# Patient Record
Sex: Female | Born: 1980 | Hispanic: Yes | Marital: Single | Smoking: Never smoker
Health system: Southern US, Community
[De-identification: ages and names within clinical notes are randomized; demographics above are authoritative.]

## PROBLEM LIST (undated history)

## (undated) DIAGNOSIS — Z789 Other specified health status: Secondary | ICD-10-CM

## (undated) HISTORY — PX: NO PAST SURGERIES: SHX2092

## (undated) HISTORY — DX: Other specified health status: Z78.9

---

## 2003-03-09 ENCOUNTER — Ambulatory Visit (HOSPITAL_COMMUNITY): Admission: RE | Admit: 2003-03-09 | Discharge: 2003-03-09 | Payer: Self-pay | Admitting: *Deleted

## 2003-08-07 ENCOUNTER — Inpatient Hospital Stay (HOSPITAL_COMMUNITY): Admission: AD | Admit: 2003-08-07 | Discharge: 2003-08-07 | Payer: Self-pay | Admitting: *Deleted

## 2003-08-07 ENCOUNTER — Inpatient Hospital Stay (HOSPITAL_COMMUNITY): Admission: AD | Admit: 2003-08-07 | Discharge: 2003-08-10 | Payer: Self-pay | Admitting: Obstetrics & Gynecology

## 2005-04-19 ENCOUNTER — Emergency Department (HOSPITAL_COMMUNITY): Admission: EM | Admit: 2005-04-19 | Discharge: 2005-04-20 | Payer: Self-pay | Admitting: *Deleted

## 2011-09-23 NOTE — L&D Delivery Note (Signed)
Delivery Note At 11:03 PM a viable female was delivered via Vaginal, Spontaneous Delivery (Presentation: Middle Occiput Posterior).  APGAR: 9, 9; weight .   Placenta status: Intact, Spontaneous.  Cord: 3 vessels with the following complications: None.  Cord pH: none  Anesthesia: Local  Episiotomy: None Lacerations: 2nd degree Suture Repair: 2.0 chromic Est. Blood Loss (mL): 350  Mom to postpartum.  Baby to nursery-stable.  HARPER,CHARLES A 08/22/2012, 11:28 PM

## 2012-02-27 LAB — OB RESULTS CONSOLE RUBELLA ANTIBODY, IGM: Rubella: IMMUNE

## 2012-02-27 LAB — OB RESULTS CONSOLE ABO/RH: RH Type: POSITIVE

## 2012-02-27 LAB — OB RESULTS CONSOLE ANTIBODY SCREEN: Antibody Screen: NEGATIVE

## 2012-02-27 LAB — OB RESULTS CONSOLE GC/CHLAMYDIA: Chlamydia: NEGATIVE

## 2012-02-27 LAB — OB RESULTS CONSOLE HEPATITIS B SURFACE ANTIGEN: Hepatitis B Surface Ag: NEGATIVE

## 2012-08-18 ENCOUNTER — Encounter (HOSPITAL_COMMUNITY): Payer: Self-pay | Admitting: *Deleted

## 2012-08-18 ENCOUNTER — Telehealth (HOSPITAL_COMMUNITY): Payer: Self-pay | Admitting: *Deleted

## 2012-08-18 NOTE — Telephone Encounter (Signed)
Preadmission screen  

## 2012-08-18 NOTE — Telephone Encounter (Signed)
Preadmission screen Interpreter number [id number] 

## 2012-08-21 ENCOUNTER — Encounter (HOSPITAL_COMMUNITY): Payer: Self-pay

## 2012-08-21 ENCOUNTER — Inpatient Hospital Stay (HOSPITAL_COMMUNITY)
Admission: AD | Admit: 2012-08-21 | Discharge: 2012-08-21 | Disposition: A | Discharge status: Home / Self Care | Payer: Self-pay | Source: Ambulatory Visit | Attending: Obstetrics | Admitting: Obstetrics

## 2012-08-21 DIAGNOSIS — O480 Post-term pregnancy: Secondary | ICD-10-CM | POA: Insufficient documentation

## 2012-08-21 NOTE — MAU Note (Signed)
Dr. Clearance Coots notified pt is outpt NST only, reactive NST, orders to d/c home, f/u as directed.

## 2012-08-21 NOTE — MAU Note (Signed)
Pt is 41 weeks, denies problems with pregnancy, for Outpt NST only.

## 2012-08-22 ENCOUNTER — Encounter (HOSPITAL_COMMUNITY): Payer: Self-pay | Admitting: Obstetrics and Gynecology

## 2012-08-22 ENCOUNTER — Inpatient Hospital Stay (HOSPITAL_COMMUNITY): Payer: Medicaid Other | Admitting: Anesthesiology

## 2012-08-22 ENCOUNTER — Encounter (HOSPITAL_COMMUNITY): Payer: Self-pay | Admitting: Anesthesiology

## 2012-08-22 ENCOUNTER — Inpatient Hospital Stay (HOSPITAL_COMMUNITY)
Admission: AD | Admit: 2012-08-22 | Discharge: 2012-08-24 | DRG: 775 | Disposition: A | Discharge status: Home / Self Care | Payer: Medicaid Other | Source: Ambulatory Visit | Attending: Obstetrics | Admitting: Obstetrics

## 2012-08-22 LAB — CBC
Hemoglobin: 10.8 g/dL — ABNORMAL LOW (ref 12.0–15.0)
MCH: 26.2 pg (ref 26.0–34.0)
RBC: 4.12 MIL/uL (ref 3.87–5.11)
WBC: 10.2 10*3/uL (ref 4.0–10.5)

## 2012-08-22 LAB — ABO/RH: ABO/RH(D): O POS

## 2012-08-22 MED ORDER — EPHEDRINE 5 MG/ML INJ
10.0000 mg | INTRAVENOUS | Status: DC | PRN
  Filled 2012-08-22: qty 4

## 2012-08-22 MED ORDER — LACTATED RINGERS IV SOLN: 500.0000 mL | Freq: Once | INTRAVENOUS | Status: DC

## 2012-08-22 MED ORDER — ACETAMINOPHEN 325 MG PO TABS: 650.0000 mg | ORAL_TABLET | ORAL | Status: DC | PRN

## 2012-08-22 MED ORDER — FENTANYL 2.5 MCG/ML BUPIVACAINE 1/10 % EPIDURAL INFUSION (WH - ANES)
14.0000 mL/h | INTRAMUSCULAR | Status: DC
  Filled 2012-08-22: qty 125

## 2012-08-22 MED ORDER — LACTATED RINGERS IV SOLN
INTRAVENOUS | Status: DC
  Administered 2012-08-22: 999 mL/h via INTRAVENOUS

## 2012-08-22 MED ORDER — PHENYLEPHRINE 40 MCG/ML (10ML) SYRINGE FOR IV PUSH (FOR BLOOD PRESSURE SUPPORT): 80.0000 ug | PREFILLED_SYRINGE | INTRAVENOUS | Status: DC | PRN

## 2012-08-22 MED ORDER — CITRIC ACID-SODIUM CITRATE 334-500 MG/5ML PO SOLN: 30.0000 mL | ORAL | Status: DC | PRN

## 2012-08-22 MED ORDER — LIDOCAINE HCL (PF) 1 % IJ SOLN
INTRAMUSCULAR | Status: DC | PRN
  Administered 2012-08-22: 7 mL
  Administered 2012-08-22: 9 mL

## 2012-08-22 MED ORDER — OXYCODONE-ACETAMINOPHEN 5-325 MG PO TABS: 1.0000 | ORAL_TABLET | ORAL | Status: DC | PRN

## 2012-08-22 MED ORDER — PHENYLEPHRINE 40 MCG/ML (10ML) SYRINGE FOR IV PUSH (FOR BLOOD PRESSURE SUPPORT)
80.0000 ug | PREFILLED_SYRINGE | INTRAVENOUS | Status: DC | PRN
  Filled 2012-08-22: qty 5

## 2012-08-22 MED ORDER — LIDOCAINE HCL (PF) 1 % IJ SOLN
30.0000 mL | INTRAMUSCULAR | Status: DC | PRN
  Administered 2012-08-22: 30 mL via SUBCUTANEOUS
  Filled 2012-08-22: qty 30

## 2012-08-22 MED ORDER — LACTATED RINGERS IV SOLN: 500.0000 mL | INTRAVENOUS | Status: DC | PRN

## 2012-08-22 MED ORDER — EPHEDRINE 5 MG/ML INJ: 10.0000 mg | INTRAVENOUS | Status: DC | PRN

## 2012-08-22 MED ORDER — FENTANYL 2.5 MCG/ML BUPIVACAINE 1/10 % EPIDURAL INFUSION (WH - ANES)
INTRAMUSCULAR | Status: DC | PRN
  Administered 2012-08-22: 14 mL/h via EPIDURAL

## 2012-08-22 MED ORDER — LACTATED RINGERS IV SOLN: INTRAVENOUS | Status: DC

## 2012-08-22 MED ORDER — OXYTOCIN 40 UNITS IN LACTATED RINGERS INFUSION - SIMPLE MED
62.5000 mL/h | INTRAVENOUS | Status: DC
  Filled 2012-08-22: qty 1000

## 2012-08-22 MED ORDER — ONDANSETRON HCL 4 MG/2ML IJ SOLN: 4.0000 mg | Freq: Four times a day (QID) | INTRAMUSCULAR | Status: DC | PRN

## 2012-08-22 MED ORDER — DIPHENHYDRAMINE HCL 50 MG/ML IJ SOLN: 12.5000 mg | INTRAMUSCULAR | Status: DC | PRN

## 2012-08-22 MED ORDER — FLEET ENEMA 7-19 GM/118ML RE ENEM: 1.0000 | ENEMA | RECTAL | Status: DC | PRN

## 2012-08-22 MED ORDER — OXYTOCIN BOLUS FROM INFUSION
500.0000 mL | INTRAVENOUS | Status: DC
  Administered 2012-08-22: 500 mL via INTRAVENOUS

## 2012-08-22 MED ORDER — IBUPROFEN 600 MG PO TABS: 600.0000 mg | ORAL_TABLET | Freq: Four times a day (QID) | ORAL | Status: DC | PRN

## 2012-08-22 NOTE — Anesthesia Procedure Notes (Signed)
Epidural Patient location during procedure: OB Start time: 08/22/2012 9:34 PM End time: 08/22/2012 9:38 PM  Staffing Anesthesiologist: Sandrea Hughs Performed by: anesthesiologist   Preanesthetic Checklist Completed: patient identified, site marked, surgical consent, pre-op evaluation, timeout performed, IV checked, risks and benefits discussed and monitors and equipment checked  Epidural Patient position: sitting Prep: site prepped and draped and DuraPrep Patient monitoring: continuous pulse ox and blood pressure Approach: midline Injection technique: LOR air  Needle:  Needle type: Tuohy  Needle gauge: 17 G Needle length: 9 cm and 9 Needle insertion depth: 5 cm cm Catheter type: closed end flexible Catheter size: 19 Gauge Catheter at skin depth: 10 cm Test dose: negative and Other  Assessment Sensory level: T8 Events: blood not aspirated, injection not painful, no injection resistance, negative IV test and no paresthesia  Additional Notes Reason for block:procedure for pain

## 2012-08-22 NOTE — Progress Notes (Signed)
Jean Dixon is a 31 y.o. G2P1001 at [redacted]w[redacted]d by LMP admitted for active labor  Subjective:   Objective: BP 113/70  Pulse 81  Temp 97.9 F (36.6 C) (Oral)  Resp 18  Ht 5\' 1"  (1.549 m)  Wt 212 lb (96.163 kg)  BMI 40.06 kg/m2  SpO2 100%      FHT:  FHR: 150 bpm, variability: moderate,  accelerations:  Present,  decelerations:  Absent UC:   regular, every 3 minutes SVE:   Dilation: 10 Effacement (%): 100 Station: +1;+2 Exam by:: T.Sprague Rn  Labs: Lab Results  Component Value Date   WBC 10.2 08/22/2012   HGB 10.8* 08/22/2012   HCT 33.6* 08/22/2012   MCV 81.6 08/22/2012   PLT 253 08/22/2012    Assessment / Plan: Spontaneous labor, progressing normally  Labor: Progressing normally Preeclampsia:  n/a Fetal Wellbeing:  Category I Pain Control:  Epidural I/D:  n/a Anticipated MOD:  NSVD  Jean Dixon A 08/22/2012, 10:43 PM

## 2012-08-22 NOTE — Anesthesia Preprocedure Evaluation (Signed)
Anesthesia Evaluation  Patient identified by MRN, date of birth, ID band Patient awake    Reviewed: Allergy & Precautions, H&P , NPO status , Patient's Chart, lab work & pertinent test results  Airway Mallampati: II TM Distance: >3 FB Neck ROM: full    Dental No notable dental hx.    Pulmonary neg pulmonary ROS,  breath sounds clear to auscultation  Pulmonary exam normal       Cardiovascular negative cardio ROS      Neuro/Psych negative neurological ROS  negative psych ROS   GI/Hepatic negative GI ROS, Neg liver ROS,   Endo/Other  Morbid obesity  Renal/GU negative Renal ROS  negative genitourinary   Musculoskeletal   Abdominal (+) + obese,   Peds negative pediatric ROS (+)  Hematology negative hematology ROS (+)   Anesthesia Other Findings   Reproductive/Obstetrics (+) Pregnancy                           Anesthesia Physical Anesthesia Plan  ASA: III  Anesthesia Plan: Epidural   Post-op Pain Management:    Induction:   Airway Management Planned:   Additional Equipment:   Intra-op Plan:   Post-operative Plan:   Informed Consent: I have reviewed the patients History and Physical, chart, labs and discussed the procedure including the risks, benefits and alternatives for the proposed anesthesia with the patient or authorized representative who has indicated his/her understanding and acceptance.     Plan Discussed with:   Anesthesia Plan Comments:         Anesthesia Quick Evaluation

## 2012-08-22 NOTE — MAU Note (Signed)
Contractions, ? Leaking fluid since 1200 today.

## 2012-08-22 NOTE — H&P (Signed)
Jean Dixon is a 31 y.o. female presenting for UC's. Maternal Medical History:  Reason for admission: Reason for admission: contractions.  31 yo G2 P1.   EDC 08-14-12.  Presents with UC's  Contractions: Onset was 3-5 hours ago.   Frequency: regular.   Perceived severity is moderate.    Fetal activity: Perceived fetal activity is normal.   Last perceived fetal movement was within the past hour.    Prenatal complications: no prenatal complications Prenatal Complications - Diabetes: none.    OB History    Grav Para Term Preterm Abortions TAB SAB Ect Mult Living   2 1 1       1      Past Medical History  Diagnosis Date  . No pertinent past medical history    Past Surgical History  Procedure Date  . No past surgeries    Family History: family history includes Asthma in her son. Social History:  reports that she has never smoked. She has never used smokeless tobacco. She reports that she does not drink alcohol or use illicit drugs.   Prenatal Transfer Tool  Maternal Diabetes: No Genetic Screening: Normal Maternal Ultrasounds/Referrals: Normal Fetal Ultrasounds or other Referrals:  None Maternal Substance Abuse:  No Significant Maternal Medications:  Meds include: Other:  Significant Maternal Lab Results:  Lab values include: Group B Strep negative Other Comments:  None  Review of Systems  All other systems reviewed and are negative.    Dilation: 5.5 Effacement (%): 100 Station: -2 Exam by:: Raelyn Mora, RN Blood pressure 125/85, pulse 77, temperature 97.7 F (36.5 C), temperature source Oral, resp. rate 18, height 5\' 1"  (1.549 m), weight 212 lb (96.163 kg), SpO2 100.00%. Maternal Exam:  Uterine Assessment: Contraction strength is firm.  Contraction frequency is regular.   Abdomen: Patient reports no abdominal tenderness. Fetal presentation: vertex  Introitus: Normal vulva. Normal vagina.  Pelvis: adequate for delivery.   Cervix: Cervix evaluated by  digital exam.     Physical Exam  Nursing note and vitals reviewed. Constitutional: She is oriented to person, place, and time. She appears well-developed and well-nourished.  HENT:  Head: Normocephalic and atraumatic.  Eyes: Conjunctivae normal are normal. Pupils are equal, round, and reactive to light.  Neck: Normal range of motion. Neck supple.  Cardiovascular: Normal rate and regular rhythm.   Respiratory: Effort normal.  GI: Soft.  Genitourinary: Vagina normal and uterus normal.  Musculoskeletal: Normal range of motion.  Neurological: She is alert and oriented to person, place, and time.  Skin: Skin is warm and dry.  Psychiatric: She has a normal mood and affect. Her behavior is normal. Judgment and thought content normal.    Prenatal labs: ABO, Rh: O/Positive/-- (06/07 0000) Antibody: Negative (06/07 0000) Rubella: Immune (06/07 0000) RPR: Nonreactive (06/07 0000)  HBsAg: Negative (06/07 0000)  HIV: Non-reactive (06/07 0000)  GBS: Negative (10/22 0000)   Assessment/Plan: 41.1 weeks.  Early labor.  Admit.   HARPER,CHARLES A 08/22/2012, 8:51 PM

## 2012-08-22 NOTE — MAU Note (Signed)
"  Hurting since 1800 this evening.  The UC's are about every 3-5 mins  Apart.  I was 3cm on Tuesday.  (+) FM.  I have been having brown d/c with blood since 1200 this afternoon."

## 2012-08-23 ENCOUNTER — Encounter (HOSPITAL_COMMUNITY): Payer: Self-pay | Admitting: *Deleted

## 2012-08-23 ENCOUNTER — Inpatient Hospital Stay (HOSPITAL_COMMUNITY): Admission: RE | Admit: 2012-08-23 | Payer: Self-pay | Source: Ambulatory Visit

## 2012-08-23 LAB — CBC
HCT: 28.7 % — ABNORMAL LOW (ref 36.0–46.0)
MCH: 25.9 pg — ABNORMAL LOW (ref 26.0–34.0)
MCV: 81.8 fL (ref 78.0–100.0)
Platelets: 243 10*3/uL (ref 150–400)
RDW: 15.2 % (ref 11.5–15.5)

## 2012-08-23 LAB — RPR: RPR Ser Ql: NONREACTIVE

## 2012-08-23 MED ORDER — ONDANSETRON HCL 4 MG/2ML IJ SOLN: 4.0000 mg | INTRAMUSCULAR | Status: DC | PRN

## 2012-08-23 MED ORDER — TETANUS-DIPHTH-ACELL PERTUSSIS 5-2.5-18.5 LF-MCG/0.5 IM SUSP
0.5000 mL | Freq: Once | INTRAMUSCULAR | Status: AC
  Administered 2012-08-23: 0.5 mL via INTRAMUSCULAR
  Filled 2012-08-23: qty 0.5

## 2012-08-23 MED ORDER — CITRIC ACID-SODIUM CITRATE 334-500 MG/5ML PO SOLN: 30.0000 mL | ORAL | Status: DC | PRN

## 2012-08-23 MED ORDER — DIBUCAINE 1 % RE OINT: 1.0000 "application " | TOPICAL_OINTMENT | RECTAL | Status: DC | PRN

## 2012-08-23 MED ORDER — WITCH HAZEL-GLYCERIN EX PADS: 1.0000 "application " | MEDICATED_PAD | CUTANEOUS | Status: DC | PRN

## 2012-08-23 MED ORDER — ONDANSETRON HCL 4 MG/2ML IJ SOLN: 4.0000 mg | Freq: Four times a day (QID) | INTRAMUSCULAR | Status: DC | PRN

## 2012-08-23 MED ORDER — MEDROXYPROGESTERONE ACETATE 150 MG/ML IM SUSP: 150.0000 mg | INTRAMUSCULAR | Status: DC | PRN

## 2012-08-23 MED ORDER — DIPHENHYDRAMINE HCL 25 MG PO CAPS: 25.0000 mg | ORAL_CAPSULE | Freq: Four times a day (QID) | ORAL | Status: DC | PRN

## 2012-08-23 MED ORDER — IBUPROFEN 600 MG PO TABS
600.0000 mg | ORAL_TABLET | Freq: Four times a day (QID) | ORAL | Status: DC | PRN
  Filled 2012-08-23 (×4): qty 1

## 2012-08-23 MED ORDER — IBUPROFEN 600 MG PO TABS
600.0000 mg | ORAL_TABLET | Freq: Four times a day (QID) | ORAL | Status: DC
  Administered 2012-08-23 – 2012-08-24 (×6): 600 mg via ORAL
  Filled 2012-08-23 (×2): qty 1

## 2012-08-23 MED ORDER — SIMETHICONE 80 MG PO CHEW: 80.0000 mg | CHEWABLE_TABLET | ORAL | Status: DC | PRN

## 2012-08-23 MED ORDER — LANOLIN HYDROUS EX OINT: TOPICAL_OINTMENT | CUTANEOUS | Status: DC | PRN

## 2012-08-23 MED ORDER — OXYTOCIN 40 UNITS IN LACTATED RINGERS INFUSION - SIMPLE MED: 62.5000 mL/h | INTRAVENOUS | Status: DC

## 2012-08-23 MED ORDER — SENNOSIDES-DOCUSATE SODIUM 8.6-50 MG PO TABS
2.0000 | ORAL_TABLET | Freq: Every day | ORAL | Status: DC
  Administered 2012-08-23: 2 via ORAL

## 2012-08-23 MED ORDER — LIDOCAINE HCL (PF) 1 % IJ SOLN
30.0000 mL | INTRAMUSCULAR | Status: DC | PRN
  Filled 2012-08-23: qty 30

## 2012-08-23 MED ORDER — ZOLPIDEM TARTRATE 5 MG PO TABS: 5.0000 mg | ORAL_TABLET | Freq: Every evening | ORAL | Status: DC | PRN

## 2012-08-23 MED ORDER — OXYCODONE-ACETAMINOPHEN 5-325 MG PO TABS: 1.0000 | ORAL_TABLET | ORAL | Status: DC | PRN

## 2012-08-23 MED ORDER — BENZOCAINE-MENTHOL 20-0.5 % EX AERO
1.0000 "application " | INHALATION_SPRAY | CUTANEOUS | Status: DC | PRN
  Administered 2012-08-23: 1 via TOPICAL
  Filled 2012-08-23: qty 56

## 2012-08-23 MED ORDER — LACTATED RINGERS IV SOLN: 500.0000 mL | INTRAVENOUS | Status: DC | PRN

## 2012-08-23 MED ORDER — ONDANSETRON HCL 4 MG PO TABS: 4.0000 mg | ORAL_TABLET | ORAL | Status: DC | PRN

## 2012-08-23 MED ORDER — OXYTOCIN BOLUS FROM INFUSION: 500.0000 mL | INTRAVENOUS | Status: DC

## 2012-08-23 MED ORDER — OXYTOCIN 40 UNITS IN LACTATED RINGERS INFUSION - SIMPLE MED: 62.5000 mL/h | INTRAVENOUS | Status: DC | PRN

## 2012-08-23 MED ORDER — ACETAMINOPHEN 325 MG PO TABS: 650.0000 mg | ORAL_TABLET | ORAL | Status: DC | PRN

## 2012-08-23 MED ORDER — FLEET ENEMA 7-19 GM/118ML RE ENEM: 1.0000 | ENEMA | RECTAL | Status: DC | PRN

## 2012-08-23 MED ORDER — PRENATAL MULTIVITAMIN CH
1.0000 | ORAL_TABLET | Freq: Every day | ORAL | Status: DC
  Administered 2012-08-24: 1 via ORAL
  Filled 2012-08-23 (×3): qty 1

## 2012-08-23 NOTE — Progress Notes (Signed)
UR chart review completed.  

## 2012-08-23 NOTE — Anesthesia Postprocedure Evaluation (Signed)
Anesthesia Post Note  Patient: Jean Dixon  Procedure(s) Performed: * No procedures listed *  Anesthesia type: Epidural  Patient location: Mother/Baby  Post pain: Pain level controlled  Post assessment: Post-op Vital signs reviewed  Last Vitals:  Filed Vitals:   08/23/12 0649  BP: 113/58  Pulse: 65  Temp: 36.6 C  Resp: 18    Post vital signs: Reviewed  Level of consciousness:alert  Complications: No apparent anesthesia complications

## 2012-08-24 LAB — TYPE AND SCREEN: Unit division: 0

## 2012-08-24 NOTE — Discharge Summary (Signed)
Obstetric Discharge Summary Reason for Admission: onset of labor Prenatal Procedures: none Intrapartum Procedures: spontaneous vaginal delivery Postpartum Procedures: none Complications-Operative and Postpartum: none Hemoglobin  Date Value Range Status  08/23/2012 9.1* 12.0 - 15.0 g/dL Final     HCT  Date Value Range Status  08/23/2012 28.7* 36.0 - 46.0 % Final    Physical Exam:  General: alert Lochia: appropriate Uterine Fundus: firm Incision: healing well DVT Evaluation: No evidence of DVT seen on physical exam.  Discharge Diagnoses: Term Pregnancy-delivered  Discharge Information: Date: 08/24/2012 Activity: pelvic rest Diet: routine Medications: Percocet Condition: stable Instructions: refer to practice specific booklet Discharge to: home Follow-up Information    Call in 6 weeks to follow up.   Contact information:   b [address]all         Newborn Data: Live born female  Birth Weight: 8 lb 5.5 oz (3785 g) APGAR: 9, 9  Home with mother.  MARSHALL,BERNARD A 08/24/2012, 7:35 AM

## 2012-08-24 NOTE — Discharge Summary (Signed)
Obstetric Discharge Summary Reason for Admission: onset of labor Prenatal Procedures: none Intrapartum Procedures: spontaneous vaginal delivery Postpartum Procedures: none Complications-Operative and Postpartum: none Hemoglobin  Date Value Range Status  08/23/2012 9.1* 12.0 - 15.0 g/dL Final     HCT  Date Value Range Status  08/23/2012 28.7* 36.0 - 46.0 % Final    Physical Exam:  General: alert Lochia: appropriate Uterine Fundus: firm Incision: healing well DVT Evaluation: No evidence of DVT seen on physical exam.  Discharge Diagnoses: Term Pregnancy-delivered  Discharge Information: Date: 08/24/2012 Activity: pelvic rest Diet: routine Medications: Percocet Condition: improved Instructions: refer to practice specific booklet Discharge to: home Follow-up Information    Call in 6 weeks to follow up.   Contact information:   b [address]         Newborn Data: Live born female  Birth Weight: 8 lb 5.5 oz (3785 g) APGAR: 9, 9  Home with mother.  Saxon Barich A 08/24/2012, 7:40 AM

## 2012-08-30 ENCOUNTER — Ambulatory Visit (HOSPITAL_COMMUNITY): Payer: MEDICAID

## 2013-08-16 LAB — OB RESULTS CONSOLE RPR: RPR: NONREACTIVE

## 2013-08-16 LAB — OB RESULTS CONSOLE HIV ANTIBODY (ROUTINE TESTING): HIV: NONREACTIVE

## 2013-09-22 NOTE — L&D Delivery Note (Signed)
Delivery Note At 3:09 PM a viable female was delivered via  (Presentation: ;  ).  APGAR: , ; weight .   Placenta status: , .  Cord:  with the following complications: .  Cord pH: not done  Anesthesia:   Episiotomy:  Lacerations:  Suture Repair: 2.0 vicryl Est. Blood Loss (mL):   Mom to postpartum.  Baby to Couplet care / Skin to Skin.  Shaunda Tipping A 03/01/2014, 3:18 PM

## 2013-10-13 ENCOUNTER — Other Ambulatory Visit (HOSPITAL_COMMUNITY): Payer: Self-pay | Admitting: Obstetrics

## 2013-10-13 ENCOUNTER — Ambulatory Visit (HOSPITAL_COMMUNITY)
Admission: RE | Admit: 2013-10-13 | Discharge: 2013-10-13 | Disposition: A | Discharge status: Home / Self Care | Payer: Medicaid Other | Source: Ambulatory Visit | Attending: Obstetrics | Admitting: Obstetrics

## 2013-10-13 ENCOUNTER — Encounter (HOSPITAL_COMMUNITY): Payer: Self-pay

## 2013-10-13 DIAGNOSIS — R7611 Nonspecific reaction to tuberculin skin test without active tuberculosis: Secondary | ICD-10-CM | POA: Insufficient documentation

## 2013-10-13 DIAGNOSIS — I517 Cardiomegaly: Secondary | ICD-10-CM | POA: Insufficient documentation

## 2014-01-24 LAB — OB RESULTS CONSOLE GC/CHLAMYDIA
Chlamydia: NEGATIVE
GC PROBE AMP, GENITAL: NEGATIVE

## 2014-01-24 LAB — OB RESULTS CONSOLE GBS
GBS: NEGATIVE
STREP GROUP B AG: NEGATIVE

## 2014-01-24 LAB — OB RESULTS CONSOLE RPR: RPR: NONREACTIVE

## 2014-01-31 LAB — OB RESULTS CONSOLE HEPATITIS B SURFACE ANTIGEN: Hepatitis B Surface Ag: NEGATIVE

## 2014-01-31 LAB — OB RESULTS CONSOLE ABO/RH: RH Type: POSITIVE

## 2014-01-31 LAB — OB RESULTS CONSOLE HIV ANTIBODY (ROUTINE TESTING)
HIV: NONREACTIVE
HIV: NONREACTIVE

## 2014-01-31 LAB — OB RESULTS CONSOLE RPR
RPR: NONREACTIVE
RPR: NONREACTIVE

## 2014-01-31 LAB — OB RESULTS CONSOLE GC/CHLAMYDIA
CHLAMYDIA, DNA PROBE: NEGATIVE
GC PROBE AMP, GENITAL: NEGATIVE

## 2014-01-31 LAB — OB RESULTS CONSOLE RUBELLA ANTIBODY, IGM: Rubella: IMMUNE

## 2014-01-31 LAB — OB RESULTS CONSOLE ANTIBODY SCREEN: Antibody Screen: NEGATIVE

## 2014-03-01 ENCOUNTER — Inpatient Hospital Stay (HOSPITAL_COMMUNITY)
Admission: RE | Admit: 2014-03-01 | Discharge: 2014-03-02 | DRG: 775 | Disposition: A | Discharge status: Home / Self Care | Payer: Medicaid Other | Source: Ambulatory Visit | Attending: Obstetrics | Admitting: Obstetrics

## 2014-03-01 ENCOUNTER — Inpatient Hospital Stay (HOSPITAL_COMMUNITY): Admission: AD | Admit: 2014-03-01 | Payer: Medicaid Other | Source: Ambulatory Visit | Admitting: Obstetrics

## 2014-03-01 ENCOUNTER — Inpatient Hospital Stay (HOSPITAL_COMMUNITY): Payer: Medicaid Other | Admitting: Anesthesiology

## 2014-03-01 ENCOUNTER — Encounter (HOSPITAL_COMMUNITY): Payer: Medicaid Other | Admitting: Anesthesiology

## 2014-03-01 ENCOUNTER — Encounter (HOSPITAL_COMMUNITY): Payer: Self-pay

## 2014-03-01 DIAGNOSIS — Z3490 Encounter for supervision of normal pregnancy, unspecified, unspecified trimester: Secondary | ICD-10-CM

## 2014-03-01 DIAGNOSIS — D649 Anemia, unspecified: Secondary | ICD-10-CM | POA: Diagnosis present

## 2014-03-01 DIAGNOSIS — O99891 Other specified diseases and conditions complicating pregnancy: Secondary | ICD-10-CM | POA: Diagnosis present

## 2014-03-01 DIAGNOSIS — O9902 Anemia complicating childbirth: Principal | ICD-10-CM | POA: Diagnosis present

## 2014-03-01 LAB — CBC
HCT: 32 % — ABNORMAL LOW (ref 36.0–46.0)
Hemoglobin: 9.9 g/dL — ABNORMAL LOW (ref 12.0–15.0)
MCH: 24.6 pg — AB (ref 26.0–34.0)
MCHC: 30.9 g/dL (ref 30.0–36.0)
MCV: 79.6 fL (ref 78.0–100.0)
PLATELETS: 213 10*3/uL (ref 150–400)
RBC: 4.02 MIL/uL (ref 3.87–5.11)
RDW: 20.4 % — ABNORMAL HIGH (ref 11.5–15.5)
WBC: 8.3 10*3/uL (ref 4.0–10.5)

## 2014-03-01 LAB — TYPE AND SCREEN
ABO/RH(D): O POS
ANTIBODY SCREEN: NEGATIVE

## 2014-03-01 LAB — RPR

## 2014-03-01 MED ORDER — WITCH HAZEL-GLYCERIN EX PADS: 1.0000 "application " | MEDICATED_PAD | CUTANEOUS | Status: DC | PRN

## 2014-03-01 MED ORDER — ONDANSETRON HCL 4 MG/2ML IJ SOLN: 4.0000 mg | Freq: Four times a day (QID) | INTRAMUSCULAR | Status: DC | PRN

## 2014-03-01 MED ORDER — PRENATAL MULTIVITAMIN CH
1.0000 | ORAL_TABLET | Freq: Every day | ORAL | Status: DC
  Administered 2014-03-02: 1 via ORAL
  Filled 2014-03-01: qty 1

## 2014-03-01 MED ORDER — PHENYLEPHRINE 40 MCG/ML (10ML) SYRINGE FOR IV PUSH (FOR BLOOD PRESSURE SUPPORT)
PREFILLED_SYRINGE | INTRAVENOUS | Status: AC
  Filled 2014-03-01: qty 10

## 2014-03-01 MED ORDER — LACTATED RINGERS IV SOLN
500.0000 mL | Freq: Once | INTRAVENOUS | Status: AC
  Administered 2014-03-01: 500 mL via INTRAVENOUS

## 2014-03-01 MED ORDER — ZOLPIDEM TARTRATE 5 MG PO TABS: 5.0000 mg | ORAL_TABLET | Freq: Every evening | ORAL | Status: DC | PRN

## 2014-03-01 MED ORDER — DIBUCAINE 1 % RE OINT
1.0000 "application " | TOPICAL_OINTMENT | RECTAL | Status: DC | PRN
  Filled 2014-03-01: qty 28

## 2014-03-01 MED ORDER — BUTORPHANOL TARTRATE 1 MG/ML IJ SOLN
1.0000 mg | INTRAMUSCULAR | Status: DC
  Filled 2014-03-01: qty 1

## 2014-03-01 MED ORDER — EPHEDRINE 5 MG/ML INJ
INTRAVENOUS | Status: AC
  Filled 2014-03-01: qty 4

## 2014-03-01 MED ORDER — TETANUS-DIPHTH-ACELL PERTUSSIS 5-2.5-18.5 LF-MCG/0.5 IM SUSP
0.5000 mL | Freq: Once | INTRAMUSCULAR | Status: AC
  Administered 2014-03-02: 0.5 mL via INTRAMUSCULAR
  Filled 2014-03-01 (×2): qty 0.5

## 2014-03-01 MED ORDER — OXYTOCIN 40 UNITS IN LACTATED RINGERS INFUSION - SIMPLE MED
1.0000 m[IU]/min | INTRAVENOUS | Status: DC
  Administered 2014-03-01: 2 m[IU]/min via INTRAVENOUS
  Administered 2014-03-01: 4 m[IU]/min via INTRAVENOUS
  Administered 2014-03-01 (×2): 6 m[IU]/min via INTRAVENOUS
  Filled 2014-03-01: qty 1000

## 2014-03-01 MED ORDER — PHENYLEPHRINE 40 MCG/ML (10ML) SYRINGE FOR IV PUSH (FOR BLOOD PRESSURE SUPPORT): 80.0000 ug | PREFILLED_SYRINGE | INTRAVENOUS | Status: DC | PRN

## 2014-03-01 MED ORDER — LIDOCAINE HCL (PF) 1 % IJ SOLN
INTRAMUSCULAR | Status: DC | PRN
  Administered 2014-03-01 (×2): 4 mL

## 2014-03-01 MED ORDER — IBUPROFEN 600 MG PO TABS: 600.0000 mg | ORAL_TABLET | Freq: Four times a day (QID) | ORAL | Status: DC | PRN

## 2014-03-01 MED ORDER — FENTANYL 2.5 MCG/ML BUPIVACAINE 1/10 % EPIDURAL INFUSION (WH - ANES): 14.0000 mL/h | INTRAMUSCULAR | Status: DC | PRN

## 2014-03-01 MED ORDER — ONDANSETRON HCL 4 MG/2ML IJ SOLN: 4.0000 mg | INTRAMUSCULAR | Status: DC | PRN

## 2014-03-01 MED ORDER — BENZOCAINE-MENTHOL 20-0.5 % EX AERO
1.0000 "application " | INHALATION_SPRAY | CUTANEOUS | Status: DC | PRN
  Administered 2014-03-01: 1 via TOPICAL
  Filled 2014-03-01 (×2): qty 56

## 2014-03-01 MED ORDER — FENTANYL 2.5 MCG/ML BUPIVACAINE 1/10 % EPIDURAL INFUSION (WH - ANES)
INTRAMUSCULAR | Status: AC
  Filled 2014-03-01: qty 125

## 2014-03-01 MED ORDER — FERROUS SULFATE 325 (65 FE) MG PO TABS
325.0000 mg | ORAL_TABLET | Freq: Two times a day (BID) | ORAL | Status: DC
  Administered 2014-03-01 – 2014-03-02 (×3): 325 mg via ORAL
  Filled 2014-03-01 (×5): qty 1

## 2014-03-01 MED ORDER — DIPHENHYDRAMINE HCL 25 MG PO CAPS
25.0000 mg | ORAL_CAPSULE | Freq: Four times a day (QID) | ORAL | Status: DC | PRN
Start: 2014-03-01 — End: 2014-03-02

## 2014-03-01 MED ORDER — FLEET ENEMA 7-19 GM/118ML RE ENEM: 1.0000 | ENEMA | RECTAL | Status: DC | PRN

## 2014-03-01 MED ORDER — OXYCODONE-ACETAMINOPHEN 5-325 MG PO TABS: 1.0000 | ORAL_TABLET | ORAL | Status: DC | PRN

## 2014-03-01 MED ORDER — SENNOSIDES-DOCUSATE SODIUM 8.6-50 MG PO TABS
2.0000 | ORAL_TABLET | ORAL | Status: DC
  Administered 2014-03-02: 2 via ORAL
  Filled 2014-03-01: qty 2

## 2014-03-01 MED ORDER — DIPHENHYDRAMINE HCL 50 MG/ML IJ SOLN: 12.5000 mg | INTRAMUSCULAR | Status: DC | PRN

## 2014-03-01 MED ORDER — FENTANYL 2.5 MCG/ML BUPIVACAINE 1/10 % EPIDURAL INFUSION (WH - ANES)
INTRAMUSCULAR | Status: DC | PRN
  Administered 2014-03-01: 13 mL/h via EPIDURAL

## 2014-03-01 MED ORDER — EPHEDRINE 5 MG/ML INJ: 10.0000 mg | INTRAVENOUS | Status: DC | PRN

## 2014-03-01 MED ORDER — LACTATED RINGERS IV SOLN: 500.0000 mL | INTRAVENOUS | Status: DC | PRN

## 2014-03-01 MED ORDER — TERBUTALINE SULFATE 1 MG/ML IJ SOLN: 0.2500 mg | Freq: Once | INTRAMUSCULAR | Status: DC | PRN

## 2014-03-01 MED ORDER — LANOLIN HYDROUS EX OINT: TOPICAL_OINTMENT | CUTANEOUS | Status: DC | PRN

## 2014-03-01 MED ORDER — LACTATED RINGERS IV SOLN
INTRAVENOUS | Status: DC
  Administered 2014-03-01: 1000 mL via INTRAVENOUS
  Administered 2014-03-01: 08:00:00 via INTRAVENOUS

## 2014-03-01 MED ORDER — ACETAMINOPHEN 325 MG PO TABS: 650.0000 mg | ORAL_TABLET | ORAL | Status: DC | PRN

## 2014-03-01 MED ORDER — OXYTOCIN BOLUS FROM INFUSION: 500.0000 mL | INTRAVENOUS | Status: DC

## 2014-03-01 MED ORDER — ONDANSETRON HCL 4 MG PO TABS: 4.0000 mg | ORAL_TABLET | ORAL | Status: DC | PRN

## 2014-03-01 MED ORDER — IBUPROFEN 600 MG PO TABS
600.0000 mg | ORAL_TABLET | Freq: Four times a day (QID) | ORAL | Status: DC
  Administered 2014-03-01 – 2014-03-02 (×5): 600 mg via ORAL
  Filled 2014-03-01 (×5): qty 1

## 2014-03-01 MED ORDER — OXYTOCIN 40 UNITS IN LACTATED RINGERS INFUSION - SIMPLE MED: 62.5000 mL/h | INTRAVENOUS | Status: DC

## 2014-03-01 MED ORDER — CITRIC ACID-SODIUM CITRATE 334-500 MG/5ML PO SOLN
30.0000 mL | ORAL | Status: DC | PRN
Start: 2014-03-01 — End: 2014-03-01

## 2014-03-01 MED ORDER — SIMETHICONE 80 MG PO CHEW
80.0000 mg | CHEWABLE_TABLET | ORAL | Status: DC | PRN
Start: 2014-03-01 — End: 2014-03-02

## 2014-03-01 MED ORDER — LIDOCAINE HCL (PF) 1 % IJ SOLN
30.0000 mL | INTRAMUSCULAR | Status: DC | PRN
  Filled 2014-03-01: qty 30

## 2014-03-01 NOTE — Anesthesia Procedure Notes (Signed)
Epidural Patient location during procedure: OB Start time: 03/01/2014 2:04 PM  Staffing Anesthesiologist: Brenisha Tsui A. Performed by: anesthesiologist   Preanesthetic Checklist Completed: patient identified, site marked, surgical consent, pre-op evaluation, timeout performed, IV checked, risks and benefits discussed and monitors and equipment checked  Epidural Patient position: sitting Prep: site prepped and draped and DuraPrep Patient monitoring: continuous pulse ox and blood pressure Approach: midline Location: L3-L4 Injection technique: LOR air  Needle:  Needle type: Tuohy  Needle gauge: 17 G Needle length: 9 cm and 9 Needle insertion depth: 5 cm cm Catheter type: closed end flexible Catheter size: 19 Gauge Catheter at skin depth: 10 cm Test dose: negative and Other  Assessment Events: blood not aspirated, injection not painful, no injection resistance, negative IV test and no paresthesia  Additional Notes Patient identified. Risks and benefits discussed including failed block, incomplete  Pain control, post dural puncture headache, nerve damage, paralysis, blood pressure Changes, nausea, vomiting, reactions to medications-both toxic and allergic and post Partum back pain. All questions were answered. Patient expressed understanding and wished to proceed. Sterile technique was used throughout procedure. Epidural site was Dressed with sterile barrier dressing. No paresthesias, signs of intravascular injection Or signs of intrathecal spread were encountered.  Patient was more comfortable after the epidural was dosed. Please see RN's note for documentation of vital signs and FHR which are stable.

## 2014-03-01 NOTE — H&P (Signed)
This is Dr. Francoise Ceo dictating the history and physical on blank blank she's a 33 year old gravida 3 para 10/24/1937 weeks EDC is 6 negative GBS desires induction cervix 2 cm 80% vertex -2 amniotomy performed the fluids clear patient is on low-dose Pitocin and having irregular contractions Past medical history negative Past surgical history negative Social history negative System review negative Physical exam well-developed female in a him in in a HEENT negative Lungs clear to P&A Heart regular rhythm no murmurs no gallops Breasts negative Abdomen term Pelvic as described above Extremities negative

## 2014-03-01 NOTE — Anesthesia Preprocedure Evaluation (Signed)
Anesthesia Evaluation  Patient identified by MRN, date of birth, ID band Patient awake    Reviewed: Allergy & Precautions, H&P , Patient's Chart, lab work & pertinent test results  Airway Mallampati: III  TM Distance: >3 FB Neck ROM: Full    Dental no notable dental hx. (+) Teeth Intact   Pulmonary neg pulmonary ROS,  breath sounds clear to auscultation  Pulmonary exam normal       Cardiovascular negative cardio ROS  Rhythm:Regular Rate:Normal     Neuro/Psych negative neurological ROS  negative psych ROS   GI/Hepatic negative GI ROS, Neg liver ROS,   Endo/Other  Obesity  Renal/GU negative Renal ROS  negative genitourinary   Musculoskeletal negative musculoskeletal ROS (+)   Abdominal (+) + obese,   Peds  Hematology  (+) anemia ,   Anesthesia Other Findings   Reproductive/Obstetrics (+) Pregnancy                             Anesthesia Physical Anesthesia Plan  ASA: II  Anesthesia Plan: Epidural   Post-op Pain Management:    Induction:   Airway Management Planned: Natural Airway  Additional Equipment:   Intra-op Plan:   Post-operative Plan:   Informed Consent: I have reviewed the patients History and Physical, chart, labs and discussed the procedure including the risks, benefits and alternatives for the proposed anesthesia with the patient or authorized representative who has indicated his/her understanding and acceptance.     Plan Discussed with: Anesthesiologist  Anesthesia Plan Comments:         Anesthesia Quick Evaluation  

## 2014-03-02 LAB — CBC
HCT: 27 % — ABNORMAL LOW (ref 36.0–46.0)
HEMOGLOBIN: 8.2 g/dL — AB (ref 12.0–15.0)
MCH: 24.6 pg — AB (ref 26.0–34.0)
MCHC: 30.4 g/dL (ref 30.0–36.0)
MCV: 80.8 fL (ref 78.0–100.0)
Platelets: 180 10*3/uL (ref 150–400)
RBC: 3.34 MIL/uL — ABNORMAL LOW (ref 3.87–5.11)
RDW: 20.8 % — AB (ref 11.5–15.5)
WBC: 8.7 10*3/uL (ref 4.0–10.5)

## 2014-03-02 NOTE — Discharge Instructions (Signed)
Discharge instructions ° °· You can wash your hair °· Shower °· Eat what you want °· Drink what you want °· See me in 6 weeks °· Your ankles are going to swell more in the next 2 weeks than when pregnant °· No sex for 6 weeks ° ° °Jame Seelig A, MD 03/02/2014 ° ° °

## 2014-03-02 NOTE — Lactation Note (Signed)
This note was copied from the chart of Jean Dixon. Lactation Consultation Note  Patient Name: Jean Dixon IRCVE'L Date: 03/02/2014 Reason for consult: Follow-up assessment;Difficult latch RN requested LC assist Mom with BF. Baby is fussy at the breast, coming on and off. RN has not observed colostrum in nipple shield. Mom has pumped and received approx. .5 ml of colostrum. LC noted Mom's nipples are flat to inverted. The left nipple is erect on the upper half and flat on the lower half. Mom was using #24 nipple shield, tried #20 to see if baby would transfer more EBM but the #24 was better fit. At this feeding, baby was able to sustain the latch using the #24 nipple shield, scant amount of colostrum visible on Mom's nipple and nipple shield. Encouraged Mom to post pump after feedings for 15 minutes to stimulate milk production. Advised to give baby back any amount of EBM she receives. Monitor voids/stools. Mom has some comfort gels for mild tenderness. Advised to call for assist as needed.   Maternal Data    Feeding Feeding Type: Breast Fed Length of feed: 25 min  LATCH Score/Interventions Latch: Grasps breast easily, tongue down, lips flanged, rhythmical sucking. (using #24 nipple shield) Intervention(s): Adjust position;Assist with latch;Breast massage;Breast compression  Audible Swallowing: A few with stimulation  Type of Nipple: Flat (assymetrical shape)  Comfort (Breast/Nipple): Soft / non-tender Intervention(s): Hand pump;Double electric pump  Problem noted: Mild/Moderate discomfort;Cracked, bleeding, blisters, bruises  Hold (Positioning): Assistance needed to correctly position infant at breast and maintain latch.  LATCH Score: 7  Lactation Tools Discussed/Used Tools: Pump;Nipple Dorris Carnes;Shells Nipple shield size: 20;24 Shell Type: Inverted Breast pump type: Double-Electric Breast Pump   Consult Status Consult Status:  Follow-up Date: 03/03/14 Follow-up type: In-patient    Alfred Levins 03/02/2014, 3:35 PM

## 2014-03-02 NOTE — Discharge Summary (Signed)
Obstetric Discharge Summary Reason for Admission: induction of labor Prenatal Procedures: none Intrapartum Procedures: spontaneous vaginal delivery Postpartum Procedures: none Complications-Operative and Postpartum: none Hemoglobin  Date Value Ref Range Status  03/02/2014 8.2* 12.0 - 15.0 g/dL Final     DELTA CHECK NOTED     REPEATED TO VERIFY     HCT  Date Value Ref Range Status  03/02/2014 27.0* 36.0 - 46.0 % Final    Physical Exam:  General: alert Lochia: appropriate Uterine Fundus: firm Incision: healing well DVT Evaluation: No evidence of DVT seen on physical exam.  Discharge Diagnoses: Term Pregnancy-delivered  Discharge Information: Date: 03/02/2014 Activity: pelvic rest Diet: routine Medications: Percocet Condition: stable Instructions: refer to practice specific booklet Discharge to: home Follow-up Information   Follow up with Kathreen Cosier, MD.   Specialty:  Obstetrics and Gynecology   Contact information:   [address] [place] [address] [phone]       Newborn Data: Live born female  Birth Weight: 8 lb 9 oz (3884 g) APGAR: 9, 9  Home with mother.  Dariona Postma A 03/02/2014, 7:08 AM

## 2014-03-02 NOTE — Progress Notes (Signed)
Patient ID: Jean Dixon, female   DOB: [birth year], 33 y.o.   MRN: [id number] Postpartum day one Vital signs normal Fundus firm Lochia moderate patient was inserted discharge today

## 2014-03-02 NOTE — Anesthesia Postprocedure Evaluation (Signed)
  Anesthesia Post-op Note  Patient: Orthoptist  Procedure(s) Performed: * No procedures listed *  Patient Location: Mother/Baby  Anesthesia Type:Epidural  Level of Consciousness: awake, alert , oriented and patient cooperative  Airway and Oxygen Therapy: Patient Spontanous Breathing  Post-op Pain: mild  Post-op Assessment: Patient's Cardiovascular Status Stable, Respiratory Function Stable, No headache, No backache, No residual numbness and No residual motor weakness  Post-op Vital Signs: stable  Last Vitals:  Filed Vitals:   03/02/14 0512  BP: 118/71  Pulse: 61  Temp: 36.8 C  Resp:     Complications: No apparent anesthesia complications

## 2014-03-02 NOTE — Lactation Note (Signed)
This note was copied from the chart of Jean Irja Acevedo-Cragle. Lactation Consultation Note Lt. Nipple inverted and Rt. Nipple slightly flat w/a little bit of nipple shape of a tube of lipstick red and cracked and painful. #24 NS given. RN gave shells and hand pump yesterday after delivery. Comfort gels given. Demonstrated NS application and care. Encouraged football haold and monitoring for colostrum. Encouraged to use hand pump prior to applying NS and to wear shells today w/bra. Encouraged to chin tug to get a wide latch if notices pinching. States feels comfortable. Patient Name: Jean Dixon [id number] Date: 03/02/2014 Reason for consult: Initial assessment;Breast/nipple pain;Difficult latch   Maternal Data Infant to breast within first hour of birth: No Has patient been taught Hand Expression?: Yes Does the patient have breastfeeding experience prior to this delivery?: Yes  Feeding Feeding Type: Breast Fed Length of feed: 15 min (still feeding)  LATCH Score/Interventions Latch: Repeated attempts needed to sustain latch, nipple held in mouth throughout feeding, stimulation needed to elicit sucking reflex. Intervention(s): Adjust position;Assist with latch;Breast massage;Breast compression  Audible Swallowing: None Intervention(s): Skin to skin;Hand expression Intervention(s): Skin to skin;Hand expression;Alternate breast massage  Type of Nipple: Inverted (inverted/flat Lt. nipple/Rt. slightly flat pulls out half way.) Intervention(s):  (shield #24)  Comfort (Breast/Nipple): Filling, red/small blisters or bruises, mild/mod discomfort Problem noted: Cracked, bleeding, blisters, bruises Intervention(s):  (NS)  Problem noted: Cracked, bleeding, blisters, bruises Interventions  (Cracked/bleeding/bruising/blister): Expressed breast milk to nipple;Hand pump  Hold (Positioning): Assistance needed to correctly position infant at breast and maintain  latch. Intervention(s): Breastfeeding basics reviewed;Support Pillows;Position options;Skin to skin  LATCH Score: 3  Lactation Tools Discussed/Used Tools: Nipple Shields;Pump;Comfort gels Nipple shield size: 24 Breast pump type: Manual   Consult Status Consult Status: Follow-up Date: 03/02/14 Follow-up type: In-patient    Jean Dixon 03/02/2014, 5:42 AM

## 2014-07-24 ENCOUNTER — Encounter (HOSPITAL_COMMUNITY): Payer: Self-pay

## 2015-11-22 ENCOUNTER — Ambulatory Visit (INDEPENDENT_AMBULATORY_CARE_PROVIDER_SITE_OTHER): Payer: Self-pay | Admitting: Physician Assistant

## 2015-11-22 VITALS — BP 114/70 | HR 76 | Temp 98.3°F | Resp 20 | Ht 61.5 in | Wt 174.0 lb

## 2015-11-22 DIAGNOSIS — M7711 Lateral epicondylitis, right elbow: Secondary | ICD-10-CM

## 2015-11-22 DIAGNOSIS — M238X1 Other internal derangements of right knee: Secondary | ICD-10-CM

## 2015-11-22 DIAGNOSIS — M25572 Pain in left ankle and joints of left foot: Secondary | ICD-10-CM

## 2015-11-22 DIAGNOSIS — M2391 Unspecified internal derangement of right knee: Secondary | ICD-10-CM

## 2015-11-22 NOTE — Progress Notes (Signed)
11/22/2015 12:37 PM   DOB: November 21, 1980 / MRN: [id number]  SUBJECTIVE:  Jean Dixon is a 35 y.o. female presenting for right elbow pain that started on month ago and is made worse with lifting.  States the pain radiates to her right arm posterior forearm.   She complains of right knee clicking that started 2 months ago.  She denies pain with this.  She has no pain with stair climbing.  Reports that she sprained her left ankle three months ago and was able to walk immediately after the the accident.  There was quite a lot of swelling which resolved in about 1 week.  She denies any bruising     She has No Known Allergies.   She  has a past medical history of No pertinent past medical history.    She  reports that she has never smoked. She has never used smokeless tobacco. She reports that she does not drink alcohol or use illicit drugs. She  reports that she currently engages in sexual activity. The patient  has past surgical history that includes No past surgeries.  Her family history includes Asthma in her son.  Review of Systems  Musculoskeletal: Positive for joint pain. Negative for falls.  Neurological: Negative for dizziness.  Endo/Heme/Allergies: Does not bruise/bleed easily.    Problem list and medications reviewed and updated by myself where necessary, and exist elsewhere in the encounter.   OBJECTIVE:  BP 114/70 mmHg  Pulse 76  Temp(Src) 98.3 F (36.8 C) (Oral)  Resp 20  Ht 5' 1.5" (1.562 m)  Wt 174 lb (78.926 kg)  BMI 32.35 kg/m2  SpO2 96%  Physical Exam  Constitutional: She is oriented to person, place, and time. She appears well-developed.  Eyes: EOM are normal. Pupils are equal, round, and reactive to light.  Cardiovascular: Normal rate.   Pulmonary/Chest: Effort normal.  Abdominal: She exhibits no distension.  Musculoskeletal: Normal range of motion.       Right elbow: Tenderness found. Lateral epicondyle (Pain with active wrist extension.  )  tenderness noted. No radial head, no medial epicondyle and no olecranon process tenderness noted.       Right knee: She exhibits normal range of motion, no swelling and no effusion. No tenderness found. No medial joint line, no lateral joint line, no MCL, no LCL and no patellar tendon tenderness noted.       Left ankle: She exhibits normal range of motion, no swelling, no ecchymosis and no deformity.  Neurological: She is alert and oriented to person, place, and time. No cranial nerve deficit.  Skin: Skin is warm and dry. She is not diaphoretic.  Psychiatric: She has a normal mood and affect.  Vitals reviewed.   No results found for this or any previous visit (from the past 72 hour(s)).  No results found.  ASSESSMENT AND PLAN  Jean Dixon was seen today for other, knee pain, other and flu vaccine.  Diagnoses and all orders for this visit:  Tennis elbow syndrome, right: Tendon anchor applied here with good relief.  Advised Aleve for bad days.    Pain in joint, ankle and foot, left: This is an old injury and it just may take more time to resolve.  Her exam is normal. Will try her in a sweedo.  Advised that she return if she is not slowly but surely returning to normal.  Knee Crepitus: Her right knee is normal.  Advised that we watch this given that she has no pain.  The patient was advised to call or return to clinic if she does not see an improvement in symptoms or to seek the care of the closest emergency department if she worsens with the above plan.   Jean Dixon, MHS, PA-C [facility] 11/22/2015 12:37 PM

## 2015-11-22 NOTE — Addendum Note (Signed)
Addended by: Ofilia Neas on: 11/22/2015 02:09 PM   Modules accepted: Kipp Brood

## 2019-03-11 ENCOUNTER — Encounter (HOSPITAL_COMMUNITY): Payer: Self-pay | Admitting: Emergency Medicine

## 2019-03-11 ENCOUNTER — Other Ambulatory Visit: Payer: Self-pay

## 2019-03-11 ENCOUNTER — Emergency Department (HOSPITAL_COMMUNITY): Payer: Self-pay

## 2019-03-11 ENCOUNTER — Emergency Department (HOSPITAL_COMMUNITY)
Admission: EM | Admit: 2019-03-11 | Discharge: 2019-03-11 | Disposition: A | Discharge status: Home / Self Care | Payer: Self-pay | Attending: Emergency Medicine | Admitting: Emergency Medicine

## 2019-03-11 DIAGNOSIS — R1031 Right lower quadrant pain: Secondary | ICD-10-CM

## 2019-03-11 DIAGNOSIS — K8020 Calculus of gallbladder without cholecystitis without obstruction: Secondary | ICD-10-CM

## 2019-03-11 LAB — COMPREHENSIVE METABOLIC PANEL
ALT: 23 U/L (ref 0–44)
AST: 19 U/L (ref 15–41)
Albumin: 4.2 g/dL (ref 3.5–5.0)
Alkaline Phosphatase: 84 U/L (ref 38–126)
Anion gap: 9 (ref 5–15)
BUN: 15 mg/dL (ref 6–20)
CO2: 22 mmol/L (ref 22–32)
Calcium: 9 mg/dL (ref 8.9–10.3)
Chloride: 108 mmol/L (ref 98–111)
Creatinine, Ser: 0.67 mg/dL (ref 0.44–1.00)
GFR calc Af Amer: >60 mL/min (ref >60)
GFR calc non Af Amer: >60 mL/min (ref >60)
Glucose, Bld: 95 mg/dL (ref 70–99)
Potassium: 3.7 mmol/L (ref 3.5–5.1)
Sodium: 139 mmol/L (ref 135–145)
Total Bilirubin: 0.3 mg/dL (ref 0.3–1.2)
Total Protein: 7.7 g/dL (ref 6.5–8.1)

## 2019-03-11 LAB — URINALYSIS, ROUTINE W REFLEX MICROSCOPIC
Bacteria, UA: NONE SEEN
Bilirubin Urine: NEGATIVE
Glucose, UA: NEGATIVE mg/dL
Hgb urine dipstick: NEGATIVE
Ketones, ur: NEGATIVE mg/dL
Nitrite: NEGATIVE
Protein, ur: NEGATIVE mg/dL
Specific Gravity, Urine: >1.046 — ABNORMAL HIGH (ref 1.005–1.030)
pH: 5 (ref 5.0–8.0)

## 2019-03-11 LAB — I-STAT BETA HCG BLOOD, ED (MC, WL, AP ONLY): I-stat hCG, quantitative: <5 m[IU]/mL (ref <5)

## 2019-03-11 LAB — CBC
HCT: 41.3 % (ref 36.0–46.0)
Hemoglobin: 12.9 g/dL (ref 12.0–15.0)
MCH: 28.4 pg (ref 26.0–34.0)
MCHC: 31.2 g/dL (ref 30.0–36.0)
MCV: 90.8 fL (ref 80.0–100.0)
Platelets: 243 10*3/uL (ref 150–400)
RBC: 4.55 MIL/uL (ref 3.87–5.11)
RDW: 12.9 % (ref 11.5–15.5)
WBC: 10.4 10*3/uL (ref 4.0–10.5)
nRBC: 0 % (ref 0.0–0.2)

## 2019-03-11 LAB — LIPASE, BLOOD: Lipase: 30 U/L (ref 11–51)

## 2019-03-11 MED ORDER — IOHEXOL 300 MG/ML  SOLN
100.0000 mL | Freq: Once | INTRAMUSCULAR | Status: AC | PRN
  Administered 2019-03-11: 100 mL via INTRAVENOUS

## 2019-03-11 NOTE — ED Notes (Signed)
Pt ambulatory from triage to room 

## 2019-03-11 NOTE — ED Triage Notes (Signed)
pt c/o RLQ x 2 days. Denies n/v/d or urinary problems.

## 2019-03-11 NOTE — ED Provider Notes (Signed)
[facility]-EMERGENCY DEPT Provider Note   CSN: [id number] Arrival date & time: 03/11/19  1536    History   Chief Complaint Chief Complaint  Patient presents with  . Abdominal Pain    HPI Jean Dixon is a 38 y.o. female.     HPI   RLQ abdominal pain, began yesterday, hs had mild in the past came and went. Cramping pain. Not associated with anything, Pain is moderate at this time.  Was cramping in RLQ and at one time had sharp pain RUQ that resolved.   No n/v/fever Appetite is ok No urinary symptoms IUD in place No vaginal discharge  Past Medical History:  Diagnosis Date  . No pertinent past medical history     Patient Active Problem List   Diagnosis Date Noted  . Normal pregnancy 03/01/2014  . NVD (normal vaginal delivery) 03/01/2014    Past Surgical History:  Procedure Laterality Date  . NO PAST SURGERIES       OB History    Gravida  3   Para  3   Term  3   Preterm      AB      Living  3     SAB      TAB      Ectopic      Multiple      Live Births  3            Home Medications    Prior to Admission medications   Not on File    Family History Family History  Problem Relation Age of Onset  . Asthma Son     Social History Social History   Tobacco Use  . Smoking status: Never Smoker  . Smokeless tobacco: Never Used  Substance Use Topics  . Alcohol use: No  . Drug use: No     Allergies   Patient has no known allergies.   Review of Systems Review of Systems  Constitutional: Negative for fever.  HENT: Negative for sore throat.   Eyes: Negative for visual disturbance.  Respiratory: Negative for cough and shortness of breath.   Cardiovascular: Negative for chest pain.  Gastrointestinal: Positive for abdominal pain. Negative for constipation, diarrhea, nausea and vomiting.  Genitourinary: Negative for difficulty urinating.  Musculoskeletal: Negative for back pain and neck pain.   Skin: Negative for rash.  Neurological: Negative for syncope and headaches.     Physical Exam Updated Vital Signs BP 112/78   Pulse 61   Temp 97.8 F (36.6 C) (Oral)   Resp 16   SpO2 100%   Physical Exam Vitals signs and nursing note reviewed.  Constitutional:      General: She is not in acute distress.    Appearance: She is well-developed. She is not diaphoretic.  HENT:     Head: Normocephalic and atraumatic.  Eyes:     Conjunctiva/sclera: Conjunctivae normal.  Neck:     Musculoskeletal: Normal range of motion.  Cardiovascular:     Rate and Rhythm: Normal rate and regular rhythm.  Pulmonary:     Effort: Pulmonary effort is normal. No respiratory distress.  Abdominal:     General: There is no distension.     Palpations: Abdomen is soft.     Tenderness: There is abdominal tenderness in the right lower quadrant. There is no guarding. Positive signs include McBurney's sign. Negative signs include Murphy's sign.  Musculoskeletal:        General: No tenderness.  Skin:  General: Skin is warm and dry.     Findings: No erythema or rash.  Neurological:     Mental Status: She is alert and oriented to person, place, and time.      ED Treatments / Results  Labs (all labs ordered are listed, but only abnormal results are displayed) Labs Reviewed  URINALYSIS, ROUTINE W REFLEX MICROSCOPIC - Abnormal; Notable for the following components:      Result Value   Specific Gravity, Urine >1.046 (*)    Leukocytes,Ua SMALL (*)    All other components within normal limits  LIPASE, BLOOD  COMPREHENSIVE METABOLIC PANEL  CBC  I-STAT BETA HCG BLOOD, ED ([facility], [facility], [facility] ONLY)    EKG None  Radiology Ct Abdomen Pelvis W Contrast  Result Date: 03/11/2019 CLINICAL DATA:  Right lower quadrant abdominal pain for 2 days. EXAM: CT ABDOMEN AND PELVIS WITH CONTRAST TECHNIQUE: Multidetector CT imaging of the abdomen and pelvis was performed using the standard protocol following bolus  administration of intravenous contrast. CONTRAST:  100mL OMNIPAQUE IOHEXOL 300 MG/ML  SOLN COMPARISON:  None. FINDINGS: Lower chest: The lung bases are clear of acute process. No pleural effusion or pulmonary lesions. The heart is normal in size. No pericardial effusion. The distal esophagus and aorta are unremarkable. Hepatobiliary: No focal hepatic lesions or intrahepatic biliary dilatation. The portal and hepatic veins are patent. Numerous rim calcified gallstones are noted in a contracted gallbladder. No findings suspicious for acute cholecystitis. No common bile duct dilatation. Pancreas: No mass, inflammation or ductal dilatation. Spleen: Normal size. No focal lesions. Small accessory spleen noted. Adrenals/Urinary Tract: Adrenal the adrenal glands and kidneys are unremarkable. No renal, ureteral or bladder calculi or mass. Stomach/Bowel: The stomach, duodenum, small bowel and colon are grossly normal without oral contrast. No acute inflammatory changes, mass lesions or obstructive findings. The terminal ileum is normal. The appendix is normal. Scattered colonic diverticulosis but no findings for acute diverticulitis. Vascular/Lymphatic: The aorta is normal in caliber. No dissection. The branch vessels are patent. The major venous structures are patent. No mesenteric or retroperitoneal mass or adenopathy. Small scattered lymph nodes are noted. Reproductive: Retroverted uterus. An IUD is noted in the endometrial canal. Both ovaries are normal. Other: No pelvic mass or adenopathy. No free pelvic fluid collections. No inguinal mass or adenopathy. No abdominal wall hernia or subcutaneous lesions. Musculoskeletal: No significant bony findings. IMPRESSION: 1. No acute abdominal/pelvic findings, mass lesions or adenopathy. Specifically, I do not see a cause for the patient's right lower quadrant abdominal pain. The appendix and terminal ileum and right ovary are normal. 2. Cholelithiasis without definite CT findings  for acute cholecystitis. 3. IUD noted in the endometrial canal in good position without complicating features. Electronically Signed   By: Rudie MeyerP.  Gallerani M.D.   On: 03/11/2019 17:27    Procedures Procedures (including critical care time)  Medications Ordered in ED Medications  iohexol (OMNIPAQUE) 300 MG/ML solution 100 mL (100 mLs Intravenous Contrast Given 03/11/19 1708)     Initial Impression / Assessment and Plan / ED Course  I have reviewed the triage vital signs and the nursing notes.  Pertinent labs & imaging results that were available during my care of the patient were reviewed by me and considered in my medical decision making (see chart for details).       38 year old female with no significant medical history presents with concern for right-sided abdominal pain.  Pregnancy test is negative.  Urinalysis shows no sign of urinary tract infection.  Labs showed no evidence of pancreatitis or hepatitis.  CT abdomen pelvis was done which showed no evidence of appendicitis, and shows normal right ovary.  Cholelithiasis present without findings of cholecystitis.  Based on history and exam, have low suspicion for cholecystitis, and also have low suspicion that the pain today represents pain from cholelithiasis.  Overall, patient has had right lower quadrant abdominal pain, although she did note a moment of it radiating to the right upper quadrant.  Discussed with patient if she does have episodes of right upper quadrant abdominal pain, would consider symptomatic cholelithiasis and recommend outpatient surgery follow-up, and if she develops constant pain or fever she needs to return to the emergency department for further care.  I have low suspicion for pelvic inflammatory disease disease, TOA, torsion, in absence of vaginal discharge or abnormalities on CT.  Recommend tylenol/ibuprofen and PCP follow up, or surgery follow up if she has intermittent RUQ abdominal pain.    Final Clinical  Impressions(s) / ED Diagnoses   Final diagnoses:  Calculus of gallbladder without cholecystitis without obstruction  Right lower quadrant abdominal pain    ED Discharge Orders    None       Gareth Morgan, MD 03/11/19 2246

## 2019-03-11 NOTE — ED Notes (Signed)
Patient transported to CT 

## 2019-03-11 NOTE — ED Notes (Signed)
Urine and culture sent to lab  

## 2019-09-12 ENCOUNTER — Encounter (HOSPITAL_COMMUNITY): Payer: Self-pay

## 2019-09-12 ENCOUNTER — Other Ambulatory Visit: Payer: Self-pay

## 2019-09-12 ENCOUNTER — Emergency Department (HOSPITAL_COMMUNITY): Payer: Self-pay

## 2019-09-12 ENCOUNTER — Emergency Department (HOSPITAL_COMMUNITY)
Admission: EM | Admit: 2019-09-12 | Discharge: 2019-09-13 | Disposition: A | Discharge status: Home / Self Care | Payer: Self-pay | Attending: Emergency Medicine | Admitting: Emergency Medicine

## 2019-09-12 DIAGNOSIS — Z20822 Contact with and (suspected) exposure to covid-19: Secondary | ICD-10-CM

## 2019-09-12 DIAGNOSIS — U071 COVID-19: Secondary | ICD-10-CM | POA: Insufficient documentation

## 2019-09-12 DIAGNOSIS — J189 Pneumonia, unspecified organism: Secondary | ICD-10-CM

## 2019-09-12 DIAGNOSIS — J1289 Other viral pneumonia: Secondary | ICD-10-CM | POA: Insufficient documentation

## 2019-09-12 LAB — CBC WITH DIFFERENTIAL/PLATELET
Abs Immature Granulocytes: 0.02 10*3/uL (ref 0.00–0.07)
Basophils Absolute: 0 10*3/uL (ref 0.0–0.1)
Basophils Relative: 0 %
Eosinophils Absolute: 0 10*3/uL (ref 0.0–0.5)
Eosinophils Relative: 1 %
HCT: 39 % (ref 36.0–46.0)
Hemoglobin: 12.2 g/dL (ref 12.0–15.0)
Immature Granulocytes: 0 %
Lymphocytes Relative: 27 %
Lymphs Abs: 2.2 10*3/uL (ref 0.7–4.0)
MCH: 27.5 pg (ref 26.0–34.0)
MCHC: 31.3 g/dL (ref 30.0–36.0)
MCV: 88 fL (ref 80.0–100.0)
Monocytes Absolute: 0.2 10*3/uL (ref 0.1–1.0)
Monocytes Relative: 3 %
Neutro Abs: 5.5 10*3/uL (ref 1.7–7.7)
Neutrophils Relative %: 69 %
Platelets: 249 10*3/uL (ref 150–400)
RBC: 4.43 MIL/uL (ref 3.87–5.11)
RDW: 14 % (ref 11.5–15.5)
WBC: 8 10*3/uL (ref 4.0–10.5)
nRBC: 0 % (ref 0.0–0.2)

## 2019-09-12 LAB — I-STAT BETA HCG BLOOD, ED (MC, WL, AP ONLY): I-stat hCG, quantitative: <5 m[IU]/mL (ref <5)

## 2019-09-12 LAB — D-DIMER, QUANTITATIVE (NOT AT ARMC): D-Dimer, Quant: 0.66 ug/mL-FEU — ABNORMAL HIGH (ref 0.00–0.50)

## 2019-09-12 LAB — COMPREHENSIVE METABOLIC PANEL
ALT: 75 U/L — ABNORMAL HIGH (ref 0–44)
AST: 53 U/L — ABNORMAL HIGH (ref 15–41)
Albumin: 3.8 g/dL (ref 3.5–5.0)
Alkaline Phosphatase: 93 U/L (ref 38–126)
Anion gap: 9 (ref 5–15)
BUN: 12 mg/dL (ref 6–20)
CO2: 23 mmol/L (ref 22–32)
Calcium: 8.7 mg/dL — ABNORMAL LOW (ref 8.9–10.3)
Chloride: 106 mmol/L (ref 98–111)
Creatinine, Ser: 0.77 mg/dL (ref 0.44–1.00)
GFR calc Af Amer: >60 mL/min (ref >60)
GFR calc non Af Amer: >60 mL/min (ref >60)
Glucose, Bld: 121 mg/dL — ABNORMAL HIGH (ref 70–99)
Potassium: 4.7 mmol/L (ref 3.5–5.1)
Sodium: 138 mmol/L (ref 135–145)
Total Bilirubin: 0.9 mg/dL (ref 0.3–1.2)
Total Protein: 7.4 g/dL (ref 6.5–8.1)

## 2019-09-12 LAB — LACTIC ACID, PLASMA: Lactic Acid, Venous: 1.2 mmol/L (ref 0.5–1.9)

## 2019-09-12 LAB — LIPASE, BLOOD: Lipase: 30 U/L (ref 11–51)

## 2019-09-12 LAB — BRAIN NATRIURETIC PEPTIDE: B Natriuretic Peptide: 20.6 pg/mL (ref 0.0–100.0)

## 2019-09-12 LAB — POC SARS CORONAVIRUS 2 AG -  ED: SARS Coronavirus 2 Ag: NEGATIVE

## 2019-09-12 LAB — TROPONIN I (HIGH SENSITIVITY): Troponin I (High Sensitivity): 2 ng/L (ref <18)

## 2019-09-12 NOTE — ED Triage Notes (Signed)
Pt states that she's had cold sx for week, COVID negative, and tonight her short of breath is worse

## 2019-09-12 NOTE — ED Provider Notes (Signed)
[facility]-EMERGENCY DEPT Provider Note   CSN: [id number] Arrival date & time: 09/12/19  4098     History No chief complaint on file. SOB, CP  Jean Dixon is a 38 y.o. female.  The history is provided by the patient and medical records. No language interpreter was used.  Shortness of Breath Severity:  Moderate Onset quality:  Gradual Duration:  1 week Timing:  Constant Progression:  Worsening Chronicity:  New Relieved by:  Nothing Worsened by:  Deep breathing, coughing and exertion Ineffective treatments:  None tried Associated symptoms: chest pain, cough, fever (subjective) and vomiting   Associated symptoms: no abdominal pain, no diaphoresis, no headaches, no neck pain, no rash, no sputum production and no wheezing   Risk factors: no hx of PE/DVT        Past Medical History:  Diagnosis Date  . No pertinent past medical history     Patient Active Problem List   Diagnosis Date Noted  . Normal pregnancy 03/01/2014  . NVD (normal vaginal delivery) 03/01/2014    Past Surgical History:  Procedure Laterality Date  . NO PAST SURGERIES       OB History    Gravida  3   Para  3   Term  3   Preterm      AB      Living  3     SAB      TAB      Ectopic      Multiple      Live Births  3           Family History  Problem Relation Age of Onset  . Asthma Son     Social History   Tobacco Use  . Smoking status: Never Smoker  . Smokeless tobacco: Never Used  Substance Use Topics  . Alcohol use: No  . Drug use: No    Home Medications Prior to Admission medications   Not on File    Allergies    Patient has no known allergies.  Review of Systems   Review of Systems  Constitutional: Positive for chills, fatigue and fever (subjective). Negative for diaphoresis.  HENT: Negative for congestion.   Respiratory: Positive for cough, chest tightness and shortness of breath. Negative for sputum production,  choking, wheezing and stridor.   Cardiovascular: Positive for chest pain. Negative for palpitations and leg swelling.  Gastrointestinal: Positive for nausea and vomiting. Negative for abdominal pain, constipation and diarrhea.  Genitourinary: Negative for dysuria and frequency.  Musculoskeletal: Negative for neck pain and neck stiffness.  Skin: Negative for rash and wound.  Neurological: Negative for dizziness, syncope, light-headedness and headaches.  Psychiatric/Behavioral: Negative for agitation.  All other systems reviewed and are negative.   Physical Exam Updated Vital Signs BP 129/83 (BP Location: Left Arm)   Pulse 68   Temp 98 F (36.7 C) (Oral)   Resp 18   Ht  (1.575 m)   Wt 86.2 kg   SpO2 99%   BMI 34.75 kg/m   Physical Exam Vitals and nursing note reviewed.  Constitutional:      General: She is not in acute distress.    Appearance: She is well-developed.  HENT:     Head: Normocephalic and atraumatic.     Nose: Nose normal. No congestion or rhinorrhea.     Mouth/Throat:     Mouth: Mucous membranes are moist.     Pharynx: No oropharyngeal exudate or posterior oropharyngeal erythema.  Eyes:  Conjunctiva/sclera: Conjunctivae normal.     Pupils: Pupils are equal, round, and reactive to light.  Cardiovascular:     Rate and Rhythm: Normal rate and regular rhythm.     Pulses: Normal pulses.     Heart sounds: No murmur.  Pulmonary:     Effort: Pulmonary effort is normal. No respiratory distress.     Breath sounds: Rhonchi present. No wheezing or rales.  Chest:     Chest wall: Tenderness present.  Abdominal:     Palpations: Abdomen is soft.     Tenderness: There is no abdominal tenderness. There is no right CVA tenderness, left CVA tenderness, guarding or rebound.  Musculoskeletal:        General: No tenderness.     Cervical back: No rigidity.     Right lower leg: No edema.     Left lower leg: No edema.  Skin:    General: Skin is warm and dry.      Capillary Refill: Capillary refill takes less than 2 seconds.     Coloration: Skin is not pale.     Findings: No erythema.  Neurological:     General: No focal deficit present.     Mental Status: She is alert.  Psychiatric:        Mood and Affect: Mood normal.     ED Results / Procedures / Treatments   Labs (all labs ordered are listed, but only abnormal results are displayed) Labs Reviewed  COMPREHENSIVE METABOLIC PANEL - Abnormal; Notable for the following components:      Result Value   Glucose, Bld 121 (*)    Calcium 8.7 (*)    AST 53 (*)    ALT 75 (*)    All other components within normal limits  D-DIMER, QUANTITATIVE (NOT AT [facility]) - Abnormal; Notable for the following components:   D-Dimer, Quant 0.66 (*)    All other components within normal limits  SARS CORONAVIRUS 2 (TAT 6-24 HRS)  CBC WITH DIFFERENTIAL/PLATELET  LACTIC ACID, PLASMA  LIPASE, BLOOD  BRAIN NATRIURETIC PEPTIDE  POC SARS CORONAVIRUS 2 AG -  ED  I-STAT BETA HCG BLOOD, ED ([facility], [facility], [facility] ONLY)  TROPONIN I (HIGH SENSITIVITY)  TROPONIN I (HIGH SENSITIVITY)    EKG EKG Interpretation  Date/Time:  Monday September 12 2019 22:01:53 EST Ventricular Rate:  65 PR Interval:    QRS Duration: 127 QT Interval:  453 QTC Calculation: 471 R Axis:   -7 Text Interpretation: Sinus rhythm Nonspecific intraventricular conduction delay Borderline abnrm T, anterolateral leads No prior ECG for comparison. S1Q3T3 pattern. No STEMI Confirmed by Theda Belfastegeler, Chris ([id number]) on 09/12/2019 10:05:24 PM   Radiology DG Chest Portable 1 View  Result Date: 09/12/2019 CLINICAL DATA:  Cough and shortness of breath EXAM: PORTABLE CHEST 1 VIEW COMPARISON:  None. FINDINGS: The heart size and mediastinal contours are within normal limits. Overall shallow degree of aeration. Bibasilar subsegmental atelectasis is noted. There is mildly increased hazy airspace opacity at the left lung base. No acute osseous abnormality. IMPRESSION: Mildly  increased hazy airspace opacity at the left lung base which could be due to atelectasis and/or early infectious etiology. Shallow degree of aeration with subsegmental bibasilar atelectasis. Electronically Signed   By: Jonna ClarkBindu  Avutu M.D.   On: 09/12/2019 21:13    Procedures Procedures (including critical care time)  Medications Ordered in ED Medications  sodium chloride (PF) 0.9 % injection (has no administration in time range)  iohexol (OMNIPAQUE) 350 MG/ML injection 100 mL (100 mLs Intravenous  Contrast Given 09/13/19 0106)  azithromycin (ZITHROMAX) tablet 500 mg (500 mg Oral Given 09/13/19 0152)  diphenhydrAMINE (BENADRYL) capsule 50 mg (50 mg Oral Given 09/13/19 0152)    ED Course  I have reviewed the triage vital signs and the nursing notes.  Pertinent labs & imaging results that were available during my care of the patient were reviewed by me and considered in my medical decision making (see chart for details).    MDM Rules/Calculators/A&P                      Anaijah Augsburger is a 38 y.o. female with no significant past medical history who presents with subjective fevers, chills, cough, chest pain, shortness of breath, nausea, vomiting, and fatigue.  Patient reports that she has had symptoms for the last week and had a negative Covid test at one point.  She reports that over the last few days she had worsening symptoms of shortness of breath and chest discomfort that is worsened with exertion and deep breathing.  She reports she has had a cough but has not been getting anything up.  She reports that she works at [employer] and is likely been in contact with Covid positive people but does not have any specific contacts.  She denies any constipation or diarrhea but does report nausea and vomiting.  She reports feeling fatigued but denies any syncopal episodes.  She reports she is having central and right-sided chest pain.  She reports it is exertional and pleuritic.  She denies  any trauma.  Denies other complaints.  No history of asthma or COPD.  On exam, lungs have slight coarseness but there is no wheezing.  Chest is slightly tender on the right side.  No murmur.  Abdomen is nontender.  Good pulses in all extremities.  No lower extremity tenderness or edema seen.  Vital signs reassuring on arrival.  EKG showed no STEMI but did show some T wave inversions.  There is also an S1Q3T3 pattern.  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  We will retest her for Covid with the rapid test and if negative, will get PCR sent.  Will get chest x-ray to look for pneumonia.  We will get screening labs given the chest discomfort.  We will get a D-dimer given the pleurisy and exertional component of her chest discomfort on the right side.  Anticipate reassessment after work-up.  If vital signs remain reassuring and work-up was overall reassuring, anticipate discharge for outpatient follow-up.  10:43 PM Work-up is begun to return.  D-dimer is elevated, will get CT PE study.  Chest x-ray did show some concern for pneumonia but rapid Covid was negative.  Will send PCR when disposition is determined.  Care transferred to oncoming team awaiting CT results. Anticipate diacharge with abx if CT is reassuring and pt is not hypoxic.   Final Clinical Impression(s) / ED Diagnoses Final diagnoses:  Suspected COVID-19 virus infection  Multifocal pneumonia    Rx / DC Orders ED Discharge Orders         Ordered    azithromycin (ZITHROMAX) 250 MG tablet  Daily     09/13/19 0224         Clinical Impression: 1. Suspected COVID-19 virus infection   2. Multifocal pneumonia     Disposition: Care transferred while awaiting CT results.  This note was prepared with assistance of Systems analyst. Occasional wrong-word or sound-a-like  substitutions may have occurred due to the inherent limitations of voice recognition software.      Marialy Urbanczyk, Gwenyth Allegra, MD 09/13/19 [id number]

## 2019-09-13 ENCOUNTER — Emergency Department (HOSPITAL_COMMUNITY): Payer: Self-pay

## 2019-09-13 LAB — SARS CORONAVIRUS 2 (TAT 6-24 HRS): SARS Coronavirus 2: POSITIVE — AB

## 2019-09-13 MED ORDER — IOHEXOL 350 MG/ML SOLN
100.0000 mL | Freq: Once | INTRAVENOUS | Status: AC | PRN
  Administered 2019-09-13: 100 mL via INTRAVENOUS

## 2019-09-13 MED ORDER — SODIUM CHLORIDE (PF) 0.9 % IJ SOLN
INTRAMUSCULAR | Status: AC
  Filled 2019-09-13: qty 50

## 2019-09-13 MED ORDER — AZITHROMYCIN 250 MG PO TABS: 250.0000 mg | ORAL_TABLET | Freq: Every day | ORAL | 0 refills | Status: DC

## 2019-09-13 MED ORDER — AZITHROMYCIN 250 MG PO TABS
500.0000 mg | ORAL_TABLET | Freq: Once | ORAL | Status: AC
  Administered 2019-09-13: 500 mg via ORAL
  Filled 2019-09-13: qty 2

## 2019-09-13 MED ORDER — DIPHENHYDRAMINE HCL 25 MG PO CAPS
50.0000 mg | ORAL_CAPSULE | Freq: Once | ORAL | Status: AC
  Administered 2019-09-13: 50 mg via ORAL
  Filled 2019-09-13: qty 2

## 2019-09-13 MED ORDER — DIPHENHYDRAMINE HCL 50 MG/ML IJ SOLN: 50.0000 mg | Freq: Once | INTRAMUSCULAR | Status: DC

## 2019-09-13 NOTE — ED Notes (Signed)
Pt's O2 sat was 98-99% while ambulating

## 2019-09-13 NOTE — ED Provider Notes (Signed)
1:00 AM  Assumed care from Dr. Sherry Ruffing.  Patient is a 38 year old female who presents to the emergency department shortness of breath.  Per previous provider, suspect patient has COVID-19.  Did have elevated D-dimer.  CTA pending.  Chest x-ray shows mildly increased hazy airspace opacity at the left lung base.  1:45 AM  Pt developed scattered peripheral urticaria after CT.  Allergies updated by CT technician.  No lip or tongue swelling.  I see only several scattered urticaria.  No hypoxia, wheezing.  She is well-appearing otherwise.  Will give oral Benadryl.  Radiologist called and stated no PE but patient does have multifocal pneumonia consistent likely with COVID-19.  Given we do not have a positive Covid test, will cover with antibiotics in case this is bacterial but suspicion is less likely.  She is doing well on room air at rest with sats of 100% but states she feels short of breath when she ambulates.  Will ambulate with pulse oximetry in the ED.  Anticipate if no hypoxia, patient will be discharged with outpatient follow-up as needed.  2:20 AM  Pt's sats were 98 to 99% with ambulation.  I feel she is safe to be discharged home with strict return precautions.  She is also comfortable with this plan.  6 - 24-hour Covid test pending.   At this time, I do not feel there is any life-threatening condition present. I have reviewed, interpreted and discussed all results (EKG, imaging, lab, urine as appropriate) and exam findings with patient/family. I have reviewed nursing notes and appropriate previous records.  I feel the patient is safe to be discharged home without further emergent workup and can continue workup as an outpatient as needed. Discussed usual and customary return precautions. Patient/family verbalize understanding and are comfortable with this plan.  Outpatient follow-up has been provided as needed. All questions have been answered.   Jean Dixon was evaluated in Emergency  Department on 09/13/2019 for the symptoms described in the history of present illness. She was evaluated in the context of the global COVID-19 pandemic, which necessitated consideration that the patient might be at risk for infection with the SARS-CoV-2 virus that causes COVID-19. Institutional protocols and algorithms that pertain to the evaluation of patients at risk for COVID-19 are in a state of rapid change based on information released by regulatory bodies including the CDC and federal and state organizations. These policies and algorithms were followed during the patient's care in the ED.    Jean Dixon, Delice Bison, DO 09/13/19 0221

## 2019-09-13 NOTE — Discharge Instructions (Signed)
Please quarantine for 10 days after the onset of symptoms.  You will need to be fever free without using Tylenol or Ibuprofen for 3 full days AND your symptoms will need to be significantly improving or resolved (other than the loss of taste and smell which can last up to 3 months) before you can come out of quarantine.  You should isolate from others that you live with as well.  Any close contacts that have seen you in the past 14 days before your symptoms have started will need to be notified and they will need to quarantine for 14 full days even if they have a negative COVID test.  COVID-19 is a viral illness and currently there are no specific outpatient treatments other than supportive measures such as alternating Tylenol and Ibuprofen, rest, increased fluid intake.  You do not need antibiotics.  If you develop chest pain, difficulty breathing, have blue lips or fingertips, begin vomiting and can not stop and can not hold down fluids, feel like you may pass out or you do pass out, have confusion, please return to the emergency department.   You may alternate Tylenol 1000 mg every 6 hours as needed for pain, fever and Ibuprofen 800 mg every 8 hours as needed for pain, fever.  Please take Ibuprofen with food.  Do not take more than 4000 mg of Tylenol (acetaminophen) in a 24 hour period.   Steps to find a Primary Care Provider (PCP):  Call [phone] or [phone] to access "[facility]."  2.  You may also go on the [facility] website at [email or web address]  3.   and Wellness also frequently accepts new patients.  [facility] [phone]  4.  There are also multiple [facility], [facility] and [facility] practices throughout the [place] that are frequently accepting new patients. You may find a clinic that is close to your home and contact  them.  [email or web address] [phone]  [facility] [email or web address]  [email or web address] [place] [email or web address] [phone]  5.  Local Health Departments also can provide primary care services.  [facility]  [place] [address] [phone]  [facility] [place] [address] [place] [facility] [place] [place] [phone]

## 2019-12-07 ENCOUNTER — Encounter (HOSPITAL_COMMUNITY): Payer: Self-pay | Admitting: Obstetrics & Gynecology

## 2019-12-07 ENCOUNTER — Inpatient Hospital Stay (HOSPITAL_COMMUNITY)
Admission: AD | Admit: 2019-12-07 | Discharge: 2019-12-07 | Disposition: A | Discharge status: Home / Self Care | Payer: Medicaid Other | Attending: Obstetrics & Gynecology | Admitting: Obstetrics & Gynecology

## 2019-12-07 ENCOUNTER — Inpatient Hospital Stay (HOSPITAL_COMMUNITY): Payer: Medicaid Other

## 2019-12-07 ENCOUNTER — Inpatient Hospital Stay (EMERGENCY_DEPARTMENT_HOSPITAL)
Admission: AD | Admit: 2019-12-07 | Discharge: 2019-12-08 | Disposition: A | Discharge status: Home / Self Care | Payer: Medicaid Other | Source: Home / Self Care | Attending: Obstetrics & Gynecology | Admitting: Obstetrics & Gynecology

## 2019-12-07 ENCOUNTER — Other Ambulatory Visit: Payer: Self-pay

## 2019-12-07 DIAGNOSIS — O09522 Supervision of elderly multigravida, second trimester: Secondary | ICD-10-CM | POA: Insufficient documentation

## 2019-12-07 DIAGNOSIS — O021 Missed abortion: Secondary | ICD-10-CM | POA: Insufficient documentation

## 2019-12-07 DIAGNOSIS — O209 Hemorrhage in early pregnancy, unspecified: Secondary | ICD-10-CM

## 2019-12-07 DIAGNOSIS — Z3A13 13 weeks gestation of pregnancy: Secondary | ICD-10-CM | POA: Insufficient documentation

## 2019-12-07 DIAGNOSIS — I951 Orthostatic hypotension: Secondary | ICD-10-CM

## 2019-12-07 DIAGNOSIS — D500 Iron deficiency anemia secondary to blood loss (chronic): Secondary | ICD-10-CM

## 2019-12-07 DIAGNOSIS — O039 Complete or unspecified spontaneous abortion without complication: Secondary | ICD-10-CM

## 2019-12-07 DIAGNOSIS — R55 Syncope and collapse: Secondary | ICD-10-CM | POA: Insufficient documentation

## 2019-12-07 DIAGNOSIS — O26891 Other specified pregnancy related conditions, first trimester: Secondary | ICD-10-CM

## 2019-12-07 HISTORY — DX: Other specified health status: Z78.9

## 2019-12-07 LAB — WET PREP, GENITAL
Clue Cells Wet Prep HPF POC: NONE SEEN
Sperm: NONE SEEN
Trich, Wet Prep: NONE SEEN
Yeast Wet Prep HPF POC: NONE SEEN

## 2019-12-07 LAB — CBC
HCT: 37.4 % (ref 36.0–46.0)
HCT: 31.1 % — ABNORMAL LOW (ref 36.0–46.0)
Hemoglobin: 11.7 g/dL — ABNORMAL LOW (ref 12.0–15.0)
Hemoglobin: 9.8 g/dL — ABNORMAL LOW (ref 12.0–15.0)
MCH: 28.1 pg (ref 26.0–34.0)
MCH: 28.2 pg (ref 26.0–34.0)
MCHC: 31.3 g/dL (ref 30.0–36.0)
MCHC: 31.5 g/dL (ref 30.0–36.0)
MCV: 89.9 fL (ref 80.0–100.0)
MCV: 89.4 fL (ref 80.0–100.0)
Platelets: 247 10*3/uL (ref 150–400)
Platelets: 227 10*3/uL (ref 150–400)
RBC: 4.16 MIL/uL (ref 3.87–5.11)
RBC: 3.48 MIL/uL — ABNORMAL LOW (ref 3.87–5.11)
RDW: 14.9 % (ref 11.5–15.5)
RDW: 14.8 % (ref 11.5–15.5)
WBC: 8.6 10*3/uL (ref 4.0–10.5)
WBC: 14.4 10*3/uL — ABNORMAL HIGH (ref 4.0–10.5)
nRBC: 0 % (ref 0.0–0.2)
nRBC: 0 % (ref 0.0–0.2)

## 2019-12-07 LAB — URINALYSIS, ROUTINE W REFLEX MICROSCOPIC
Bilirubin Urine: NEGATIVE
Glucose, UA: NEGATIVE mg/dL
Ketones, ur: NEGATIVE mg/dL
Nitrite: NEGATIVE
Protein, ur: NEGATIVE mg/dL
Specific Gravity, Urine: 1.003 — ABNORMAL LOW (ref 1.005–1.030)
pH: 6 (ref 5.0–8.0)

## 2019-12-07 LAB — POCT PREGNANCY, URINE: Preg Test, Ur: POSITIVE — AB

## 2019-12-07 LAB — HIV ANTIBODY (ROUTINE TESTING W REFLEX): HIV Screen 4th Generation wRfx: NONREACTIVE

## 2019-12-07 LAB — HCG, QUANTITATIVE, PREGNANCY: hCG, Beta Chain, Quant, S: 1452 m[IU]/mL — ABNORMAL HIGH (ref <5)

## 2019-12-07 MED ORDER — LACTATED RINGERS IV BOLUS
1000.0000 mL | Freq: Once | INTRAVENOUS | Status: AC
  Administered 2019-12-07: 1000 mL via INTRAVENOUS

## 2019-12-07 MED ORDER — MISOPROSTOL 200 MCG PO TABS
800.0000 ug | ORAL_TABLET | Freq: Once | ORAL | Status: AC
  Administered 2019-12-07: 800 ug via VAGINAL
  Filled 2019-12-07 (×2): qty 4

## 2019-12-07 MED ORDER — MISOPROSTOL 200 MCG PO TABS: 800.0000 ug | ORAL_TABLET | Freq: Once | ORAL | 0 refills | Status: DC

## 2019-12-07 MED ORDER — IBUPROFEN 600 MG PO TABS: 600.0000 mg | ORAL_TABLET | Freq: Four times a day (QID) | ORAL | 1 refills | Status: DC | PRN

## 2019-12-07 MED ORDER — KETOROLAC TROMETHAMINE 10 MG PO TABS
10.0000 mg | ORAL_TABLET | Freq: Once | ORAL | Status: AC
  Administered 2019-12-07: 10 mg via ORAL
  Filled 2019-12-07 (×3): qty 1

## 2019-12-07 NOTE — Progress Notes (Signed)
GC/Chlamydia and wet prep cultures obtained by Ivonne Andrew, CNM.

## 2019-12-07 NOTE — MAU Provider Note (Addendum)
Chief Complaint: Vaginal Bleeding   Seen by provider at 2100   SUBJECTIVE HPI: Jean Dixon is a 40 y.o. G4P3003 at 8 weeks by [place] with Cytotec today for missed ab who presents by EMS for syncope and vaginal bleeding at home.  The pt reports she passed the baby in the toilet and had more bleeding with clots. She woke up on the floor of the bathroom and saw that she had vomited and fallen but did not remember this.  Then she went to bed and later her family member found her on the floor next to the bed and called EMS.  She reports bleeding like a heavy period with golf ball sized clots. There is associated abdominal cramping that is mild/moderate like menstrual cramping. There are no other symptoms. She has not tried any other treatments.   Vaginal Bleeding The patient's primary symptoms include vaginal bleeding. The patient's pertinent negatives include no genital itching, genital lesions, genital odor, pelvic pain or vaginal discharge. This is a new problem. The current episode started today. The problem occurs intermittently. The problem has been gradually improving. The pain is moderate. She is pregnant. Associated symptoms include abdominal pain. Pertinent negatives include no chills, constipation, diarrhea, dysuria, fever, flank pain, headaches, nausea or vomiting. The vaginal discharge was bloody. The vaginal bleeding is lighter than menses. She has been passing clots. She has been passing tissue. Nothing aggravates the symptoms. She has tried nothing for the symptoms.  Dizziness This is a new problem. The current episode started today. Associated symptoms include abdominal pain and weakness. Pertinent negatives include no chest pain, chills, fatigue, fever, headaches, myalgias, nausea, numbness, urinary symptoms, vertigo, visual change or vomiting. The symptoms are aggravated by standing. She has tried nothing for the symptoms.    Past Medical History:  Diagnosis Date  . Medical  history non-contributory   . No pertinent past medical history    Past Surgical History:  Procedure Laterality Date  . NO PAST SURGERIES     Social History   Socioeconomic History  . Marital status: Single    Spouse name: Not on file  . Number of children: Not on file  . Years of education: Not on file  . Highest education level: Not on file  Occupational History  . Not on file  Tobacco Use  . Smoking status: Never Smoker  . Smokeless tobacco: Never Used  Substance and Sexual Activity  . Alcohol use: No  . Drug use: No  . Sexual activity: Yes  Other Topics Concern  . Not on file  Social History Narrative  . Not on file   Social Determinants of Health   Financial Resource Strain:   . Difficulty of Paying Living Expenses:   Food Insecurity:   . Worried About Programme researcher, broadcasting/film/video in the Last Year:   . Barista in the Last Year:   Transportation Needs:   . Freight forwarder (Medical):   Marland Kitchen Lack of Transportation (Non-Medical):   Physical Activity:   . Days of Exercise per Week:   . Minutes of Exercise per Session:   Stress:   . Feeling of Stress :   Social Connections:   . Frequency of Communication with Friends and Family:   . Frequency of Social Gatherings with Friends and Family:   . Attends Religious Services:   . Active Member of Clubs or Organizations:   . Attends Banker Meetings:   Marland Kitchen Marital Status:   Intimate Programme researcher, broadcasting/film/video  Violence:   . Fear of Current or Ex-Partner:   . Emotionally Abused:   Marland Kitchen Physically Abused:   . Sexually Abused:    No current facility-administered medications on file prior to encounter.   Current Outpatient Medications on File Prior to Encounter  Medication Sig Dispense Refill  . ibuprofen (ADVIL) 600 MG tablet Take 1 tablet (600 mg total) by mouth every 6 (six) hours as needed for moderate pain or cramping. 30 tablet 1  . misoprostol (CYTOTEC) 200 MCG tablet Place 4 tablets (800 mcg total) vaginally once for 1  dose. If you have not passed pregnancy tissue by tomorrow afternoon (12/08/19) 4 tablet 0   Allergies  Allergen Reactions  . Drug Ingredient [Iohexol] Hives    Pt developed hived after injection omni omni 350 09/12/19    ROS:  Review of Systems  Constitutional: Negative for chills, fatigue and fever.  Respiratory: Negative for shortness of breath.   Cardiovascular: Negative for chest pain.  Gastrointestinal: Positive for abdominal pain. Negative for constipation, diarrhea, nausea and vomiting.  Genitourinary: Positive for vaginal bleeding. Negative for difficulty urinating, dysuria, flank pain, pelvic pain, vaginal discharge and vaginal pain.  Musculoskeletal: Negative for myalgias.  Neurological: Positive for dizziness, syncope and weakness. Negative for vertigo, numbness and headaches.  Psychiatric/Behavioral: Negative.    I have reviewed patient's Past Medical Hx, Surgical Hx, Family Hx, Social Hx, medications and allergies.   Physical Exam   Patient Vitals for the past 24 hrs:  BP Temp Temp src Pulse Resp SpO2  12/07/19 2216 (!) 98/54 -- -- 62 -- --  12/07/19 2215 -- -- -- -- -- 100 %  12/07/19 2210 -- -- -- -- -- 99 %  12/07/19 2205 -- -- -- -- -- 99 %  12/07/19 2201 96/60 -- -- 77 -- --  12/07/19 2155 -- -- -- -- -- 99 %  12/07/19 2151 (!) 92/55 -- -- 68 -- --  12/07/19 2150 -- -- -- -- -- 99 %  12/07/19 2146 (!) 99/52 -- -- 61 -- --  12/07/19 2137 (!) 91/51 -- -- (!) 57 -- --  12/07/19 2132 (!) 90/57 -- -- 94 -- --  12/07/19 2131 (!) 100/58 -- -- 76 -- --  12/07/19 2116 (!) 94/57 -- -- 78 -- --  12/07/19 2115 -- -- -- -- -- 100 %  12/07/19 2110 -- -- -- -- -- 100 %  12/07/19 2105 -- -- -- -- -- 100 %  12/07/19 2101 (!) 102/57 -- -- 80 -- --  12/07/19 2100 -- -- -- -- -- 100 %  12/07/19 2055 -- -- -- -- -- 99 %  12/07/19 2054 102/60 99 F (37.2 C) Oral 79 18 --   Vitals:   12/08/19 0016 12/08/19 0031 12/08/19 0046 12/08/19 0101  BP: (!) 94/50 (!) 92/48 (!) 83/45  (!) 88/52  Pulse: 68 66 (!) 58 64  Resp:      Temp:      TempSrc:      SpO2:        Constitutional: Well-developed, well-nourished female in no acute distress, but weak..  Cardiovascular: normal rate Respiratory: normal effort GI: Abd soft, non-tender. Pos BS x 4 MS: Extremities nontender, no edema, normal ROM Neurologic: Alert and oriented x 4.  GU: Neg CVAT.  PELVIC EXAM: Large gray/brown clot, c/w POCs removed from cervical os on exam, moderate amount of bleeding requiring 3-4 fox swabs to visualize cervix.  Bleeding slowed on exam following removal of POCs.  LAB RESULTS Results for orders placed or performed during the hospital encounter of 12/07/19 (from the past 24 hour(s))  Urinalysis, Routine w reflex microscopic     Status: Abnormal   Collection Time: 12/07/19  8:46 AM  Result Value Ref Range   Color, Urine STRAW (A) YELLOW   APPearance HAZY (A) CLEAR   Specific Gravity, Urine 1.003 (L) 1.005 - 1.030   pH 6.0 5.0 - 8.0   Glucose, UA NEGATIVE NEGATIVE mg/dL   Hgb urine dipstick LARGE (A) NEGATIVE   Bilirubin Urine NEGATIVE NEGATIVE   Ketones, ur NEGATIVE NEGATIVE mg/dL   Protein, ur NEGATIVE NEGATIVE mg/dL   Nitrite NEGATIVE NEGATIVE   Leukocytes,Ua TRACE (A) NEGATIVE   RBC / HPF 0-5 0 - 5 RBC/hpf   WBC, UA 11-20 0 - 5 WBC/hpf   Bacteria, UA FEW (A) NONE SEEN   Squamous Epithelial / LPF 0-5 0 - 5  Pregnancy, urine POC     Status: Abnormal   Collection Time: 12/07/19  8:47 AM  Result Value Ref Range   Preg Test, Ur POSITIVE (A) NEGATIVE  hCG, quantitative, pregnancy     Status: Abnormal   Collection Time: 12/07/19  9:53 AM  Result Value Ref Range   hCG, Beta Chain, Quant, S 1,452 (H) <5 mIU/mL  CBC     Status: Abnormal   Collection Time: 12/07/19  9:53 AM  Result Value Ref Range   WBC 8.6 4.0 - 10.5 K/uL   RBC 4.16 3.87 - 5.11 MIL/uL   Hemoglobin 11.7 (L) 12.0 - 15.0 g/dL   HCT 31.5 94.5 - 85.9 %   MCV 89.9 80.0 - 100.0 fL   MCH 28.1 26.0 - 34.0 pg    MCHC 31.3 30.0 - 36.0 g/dL   RDW 29.2 44.6 - 28.6 %   Platelets 247 150 - 400 K/uL   nRBC 0.0 0.0 - 0.2 %  HIV Antibody (routine testing w rflx)     Status: None   Collection Time: 12/07/19  9:53 AM  Result Value Ref Range   HIV Screen 4th Generation wRfx NON REACTIVE NON REACTIVE  Wet prep, genital     Status: Abnormal   Collection Time: 12/07/19 11:58 AM   Specimen: Vaginal  Result Value Ref Range   Yeast Wet Prep HPF POC NONE SEEN NONE SEEN   Trich, Wet Prep NONE SEEN NONE SEEN   Clue Cells Wet Prep HPF POC NONE SEEN NONE SEEN   WBC, Wet Prep HPF POC FEW (A) NONE SEEN   Sperm NONE SEEN        IMAGING US OB LESS THAN 14 WEEKS WITH OB TRANSVAGINAL  Result Date: 12/07/2019 CLINICAL DATA:  Vaginal bleeding in first trimester of pregnancy. EXAM: OBSTETRIC <14 WK [place] AND TRANSVAGINAL OB US TECHNIQUE: Both transabdominal and transvaginal ultrasound examinations were performed for complete evaluation of the gestation as well as the maternal uterus, adnexal regions, and pelvic cul-de-sac. Transvaginal technique was performed to assess early pregnancy. COMPARISON:  None. FINDINGS: Intrauterine gestational sac: Single but irregular in appearance. Yolk sac:  Visualized. Embryo:  Visualized. Cardiac Activity: Not Visualized. CRL:  19.4 mm   8 w   3 d                  [place] EDC: July 15, 2020 Subchorionic hemorrhage:  Small subchronic hemorrhage is noted. Maternal uterus/adnexae: Probable corpus luteum cyst seen in right ovary. Left ovary is unremarkable. Trace free fluid is noted which most likely is physiologic. No  definite IUD is noted. IMPRESSION: Findings meet definitive criteria for failed pregnancy. This follows SRU consensus guidelines: Diagnostic Criteria for Nonviable Pregnancy Early in the First Trimester. [facility] [phone]. Electronically Signed   By: Lupita Raider M.D.   On: 12/07/2019 11:10    MDM:  Called to room by RN. Pt standing for orthostatic VS and had syncopal  episode, and was assisted to floor by 2 RNs.  Pt assisted back to bed and lying flat.  IV fluids started, labs drawn.  Bedside US ordered.  Report to Wynelle Bourgeois, CNM, with CBC, bedside US pending.    Sharen Counter Certified Nurse-Midwife 12/07/2019  10:26 PM  Consulted Dr Despina Hidden who recommends Head/neck CT and 3 liters of fluid for rehydration  0110:   We have given her 3 liters of fluid             Head and neck CT are both normal/negative             BPs remain hypotensive, though not tachycardic  Catheterized as pt has not been able to void, despite 3 liters of fluid Will repeat CBC and CMET to assess for concealed hemorrhage  0300:  Did not feel need to urinate, so we catheterized her for            Added a fourth liter of IV fluid              Vitals:   12/08/19 0116 12/08/19 0131 12/08/19 0201 12/08/19 0216  BP: (!) 92/46 (!) 95/51 (!) 98/53 (!) 98/53  Pulse: 66 (!) 59 64 60  Resp:      Temp:      TempSrc:      SpO2:                  We tried sitting her straight up, which she initially tolerated and then started to feel nauseated and dizzy.               BP 64/30             HOB flattened back down.   Consulted Dr Despina Hidden:   He advises giving a unit of blood 0311:   HR then dipped in to 40s  >> 12 lead EKG done >> Sinus bradycardia, nonsp Twave abn 0635:   Blood transfusion completed.               Feels better.  Assisted up to bedside commode and she voided large amount   Tolerated being up very well.  Vitals:   12/08/19 0620 12/08/19 0625 12/08/19 0630 12/08/19 0631  BP:  (!) 90/52  (!) 96/55  Pulse:  (!) 58  73  Resp:      Temp:      TempSrc:      SpO2: 99% 98% 98%    Bleeding remains light.  No clots passed WIll discharge home with followup in 2 wks in clinic Instructed to have assistance any time she gets up today Bleeding precautions Encouraged to return here or to other Urgent Care/ED if she develops worsening of symptoms, increase in pain,  fever, or other concerning symptoms.    Aviva Signs, CNM

## 2019-12-07 NOTE — MAU Note (Signed)
.   Jean Dixon is a 39 y.o. at [redacted]w[redacted]d here in MAU reporting: that she started having vaginal bleeding yesterday with lower abdominal cramping and lower back discomfort. PT states she had a positive pregnancy test in December. Had IUD placed in 2019. Went to the health dept to confirm pregnancy and they did a pelvic and did not see IUD. Has an U/S scheduled with them March the 19th. LMP: 09/06/19 Onset of complaint: ongoing  Pain score: 2 Vitals:   12/07/19 0854  BP: 133/81  Pulse: 70  Resp: 16  Temp: 98 F (36.7 C)  SpO2: 100%     FHT: Lab orders placed from triage: UA/UPT

## 2019-12-07 NOTE — MAU Note (Signed)
At 6pm, Felt the baby come out in toilet , got up to see the baby, sit back down, felt faint after 15 min and woke up and her head was against wall.  Son helped her get up from floor and noticed there was vomit on floor but didn't know she had vomited.  Reports felt blood coming out like she was peeing but it was blood. Had 2-3 golf ball size clots at home. Then at 7:30 pm, vomited again and fainted off bed to floor.  Brother got her up again and called EMS. EMS reported systolic bp was 88 palpable then 90's systolic, started IV and received 400 ml and at arrival was 119/56 per EMS.

## 2019-12-07 NOTE — Discharge Instructions (Signed)
Incomplete Miscarriage A miscarriage is the loss of an unborn baby (fetus) before the 20th week of pregnancy. In an incomplete miscarriage, parts of the fetus or placenta (afterbirth) remain in the body. Most miscarriages happen in the first 3 months of pregnancy. Sometimes, it happens before a woman even knows she is pregnant. Having a miscarriage can be an emotional experience. If you have had a miscarriage, talk with your health care provider about any questions you may have about miscarrying, the grieving process, and your future pregnancy plans. What are the causes? This condition may be caused by:  Problems with the genes or chromosomes that make it impossible for the baby to develop normally. These problems are most often the result of random errors that occur early in development, and are not passed from parent to child (not inherited).  Infection of the cervix or uterus.  Conditions that affect hormone balance in the body.  Problems with the cervix, such as the cervix opening and thinning before pregnancy is at term (cervical insufficiency).  Problems with the uterus, such as a uterus with an abnormal shape, fibroids in the uterus, or problems that were present from birth (congenital abnormalities).  Certain medical conditions.  Smoking, drinking alcohol, or using drugs.  Injury (trauma). Many times, the cause of a miscarriage is not known. What are the signs or symptoms? Symptoms of this condition include:  Vaginal bleeding or spotting, with or without cramps or pain.  Pain or cramping in the abdomen or lower back.  Passing fluid, tissue, or blood clots from the vagina. How is this diagnosed? This condition may be diagnosed based on:  A physical exam.  Ultrasound.  Blood tests.  Urine tests. How is this treated? An incomplete miscarriage may be treated with:  Dilation and curettage (D&C). This is a procedure in which the cervix is stretched open and the lining of  the uterus (endometrium) is scraped to remove any remaining tissue from the pregnancy.  Medicines, such as: ? Antibiotic medicine to treat infection. ? Medicine to help any remaining tissue pass out of your uterus. ? Medicine to reduce (contract) the size of the uterus. These medicines may be given if you have a lot of bleeding. If you have Rh negative blood and your baby was Rh positive, you will need a shot of medicine called Rh immunoglobulinto protect future babies from Rh blood problems. "Rh-negative" and "Rh-positive" refer to whether or not the blood has a specific protein found on the surface of red blood cells (Rh factor). Follow these instructions at home: Medicines   Take over-the-counter and prescription medicines only as told by your health care provider.  If you were prescribed antibiotic medicine, take your antibiotic as told by your health care provider. Do not stop taking the antibiotic even if you start to feel better.  Do not take NSAIDs, such as aspirin and ibuprofen, unless approved by your doctor. These medicines can cause bleeding. Activity  Rest as directed. Ask your health care provider what activities are safe for you.  Have someone help with home and family responsibilities during this time. General instructions  Keep track of the number of sanitary pads you use each day and how soaked (saturated) they are. Write down this information.  Monitor the amount of tissue or blood clots that you pass from your vagina. Save any large amounts of tissue for your health care provider to examine.  Do not use tampons, douche, or have sex until your health care provider   approves.  To help you and your partner with the process of grieving, talk with your health care provider or seek counseling to help cope with the pregnancy loss.  When you are ready, meet with your health care provider to discuss important steps you should take for your health, as well as steps to take in  order to have a healthy pregnancy in the future.  Keep all follow-up visits as told by your health care provider. This is important. Where to find more information  The American Congress of Obstetricians and Gynecologists: www.acog.org  U.S. Department of Health and Human Services Office of Women's Health: www.womenshealth.gov Contact a health care provider if:  You have a fever or chills.  You have a foul smelling vaginal discharge. Get help right away if:  You have severe cramps or pain in your back or abdomen.  You pass walnut-sized (or larger) blood clots or tissue from your vagina.  You have heavy bleeding, soaking more than 1 regular sanitary pad in an hour.  You become lightheaded or weak.  You pass out.  You have feelings of sadness that take over your thoughts, or you have thoughts of hurting yourself. Summary  In an incomplete miscarriage, parts of the fetus or placenta (afterbirth) remain in the body.  There are multiple treatment options for an incomplete miscarriage, talk to your health care provider about the best option for you.  Follow your health care provider's instructions for follow-up care.  To help you and your partner with the process of grieving, talk with your health care provider or seek counseling to help cope with the pregnancy loss. This information is not intended to replace advice given to you by your health care provider. Make sure you discuss any questions you have with your health care provider. Document Revised: 10/15/2017 Document Reviewed: 10/15/2016 Elsevier Patient Education  2020 [employer]  

## 2019-12-07 NOTE — MAU Provider Note (Signed)
Chief Complaint: Vaginal Bleeding   First Provider Initiated Contact with Patient 12/07/19 0947     SUBJECTIVE HPI: Jean Dixon is a 39 y.o. [id number] at [redacted]w[redacted]d who presents to Maternity Admissions reporting: light vaginal bleeding and mild cramping since yesterday. Pos UPT at [facility].  No other testing this pregnancy. Had IUD placed 2018. On pelvic exam in [facility] IUD string were not seen. [place] scheduled 12/09/19.    Passage of tissue or clots: Denies Dizziness: denies  O POS  Pain Location: suprapubic Quality: cramping Severity: 3/10 on pain scale Duration: 24 hours Course: unchanged Context: early pregnancy. IUD not confirmed Timing: intermittent Modifying factors: none. Hasn't tried anything Associated signs and symptoms: pos fir light vaginal bleeding. Neg for urinary complaints, GI complaints. Vaginal discharge. Pos for vaginal bleeding.   Past Medical History:  Diagnosis Date  . Medical history non-contributory   . No pertinent past medical history    OB History  Gravida Para Term Preterm AB Living  4 3 3     3   SAB TAB Ectopic Multiple Live Births          3    # Outcome Date GA Lbr Len/2nd Weight Sex Delivery Anes PTL Lv  4 Current           3 Term 03/01/14 [redacted]w[redacted]d 06:25 / 00:29 3884 g F Vag-Spont EPI  LIV  2 Term 08/22/12 [redacted]w[redacted]d 04:33 / 00:30 3785 g M Vag-Spont Local  LIV  1 Term 2004 [redacted]w[redacted]d  4451 g M Vag-Spont   LIV   Past Surgical History:  Procedure Laterality Date  . NO PAST SURGERIES     Social History   Socioeconomic History  . Marital status: Single    Spouse name: Not on file  . Number of children: Not on file  . Years of education: Not on file  . Highest education level: Not on file  Occupational History  . Not on file  Tobacco Use  . Smoking status: Never Smoker  . Smokeless tobacco: Never Used  Substance and Sexual Activity  . Alcohol use: No  . Drug use: No  . Sexual activity: Yes  Other Topics Concern  . Not on file  Social History  Narrative  . Not on file   Social Determinants of Health   Financial Resource Strain:   . Difficulty of Paying Living Expenses:   Food Insecurity:   . Worried About Charity fundraiser in the Last Year:   . Arboriculturist in the Last Year:   Transportation Needs:   . Film/video editor (Medical):   Marland Kitchen Lack of Transportation (Non-Medical):   Physical Activity:   . Days of Exercise per Week:   . Minutes of Exercise per Session:   Stress:   . Feeling of Stress :   Social Connections:   . Frequency of Communication with Friends and Family:   . Frequency of Social Gatherings with Friends and Family:   . Attends Religious Services:   . Active Member of Clubs or Organizations:   . Attends Archivist Meetings:   Marland Kitchen Marital Status:   Intimate Partner Violence:   . Fear of Current or Ex-Partner:   . Emotionally Abused:   Marland Kitchen Physically Abused:   . Sexually Abused:    No current facility-administered medications on file prior to encounter.   No current outpatient medications on file prior to encounter.   Allergies  Allergen Reactions  . Drug Ingredient [Iohexol] Hives  Pt developed hived after injection omni omni 350 09/12/19    I have reviewed the past Medical Hx, Surgical Hx, Social Hx, Allergies and Medications.   Review of Systems  Constitutional: Negative for chills and fever.  Gastrointestinal: Positive for abdominal pain. Negative for constipation, diarrhea, nausea and vomiting.  Genitourinary: Positive for vaginal bleeding. Negative for dysuria, flank pain, frequency, hematuria and vaginal discharge.  Musculoskeletal: Negative for back pain.  Neurological: Negative for dizziness.    OBJECTIVE Patient Vitals for the past 24 hrs:  BP Temp Temp src Pulse Resp SpO2 Height Weight  12/07/19 1343 114/78 98.6 F (37 C) Tympanic 74 18 100 % -- --  12/07/19 0854 133/81 98 F (36.7 C) -- 70 16 100 % 5\' 1"  (1.549 m) 90.3 kg   Constitutional: Well-developed,  well-nourished female in no acute distress.  Cardiovascular: normal rate Respiratory: normal rate and effort.  GI: Abd soft, non-tender.  MS: Extremities nontender, no edema, normal ROM Neurologic: Alert and oriented x 4.  GU: Neg CVAT.  SPECULUM EXAM: Declined    BIMANUAL: NEFG, physiologic discharge, small amount of blood noted, cervix closed; uterus slightly enlarged, no adnexal tenderness or masses. No CMT.  LAB RESULTS Results for orders placed or performed during the hospital encounter of 12/07/19 (from the past 24 hour(s))  Urinalysis, Routine w reflex microscopic     Status: Abnormal   Collection Time: 12/07/19  8:46 AM  Result Value Ref Range   Color, Urine STRAW (A) YELLOW   APPearance HAZY (A) CLEAR   Specific Gravity, Urine 1.003 (L) 1.005 - 1.030   pH 6.0 5.0 - 8.0   Glucose, UA NEGATIVE NEGATIVE mg/dL   Hgb urine dipstick LARGE (A) NEGATIVE   Bilirubin Urine NEGATIVE NEGATIVE   Ketones, ur NEGATIVE NEGATIVE mg/dL   Protein, ur NEGATIVE NEGATIVE mg/dL   Nitrite NEGATIVE NEGATIVE   Leukocytes,Ua TRACE (A) NEGATIVE   RBC / HPF 0-5 0 - 5 RBC/hpf   WBC, UA 11-20 0 - 5 WBC/hpf   Bacteria, UA FEW (A) NONE SEEN   Squamous Epithelial / LPF 0-5 0 - 5  Pregnancy, urine POC     Status: Abnormal   Collection Time: 12/07/19  8:47 AM  Result Value Ref Range   Preg Test, Ur POSITIVE (A) NEGATIVE  hCG, quantitative, pregnancy     Status: Abnormal   Collection Time: 12/07/19  9:53 AM  Result Value Ref Range   hCG, Beta Chain, Quant, S 1,452 (H) <5 mIU/mL  CBC     Status: Abnormal   Collection Time: 12/07/19  9:53 AM  Result Value Ref Range   WBC 8.6 4.0 - 10.5 K/uL   RBC 4.16 3.87 - 5.11 MIL/uL   Hemoglobin 11.7 (L) 12.0 - 15.0 g/dL   HCT 12/09/19 96.2 - 95.2 %   MCV 89.9 80.0 - 100.0 fL   MCH 28.1 26.0 - 34.0 pg   MCHC 31.3 30.0 - 36.0 g/dL   RDW 84.1 32.4 - 40.1 %   Platelets 247 150 - 400 K/uL   nRBC 0.0 0.0 - 0.2 %  HIV Antibody (routine testing w rflx)     Status:  None   Collection Time: 12/07/19  9:53 AM  Result Value Ref Range   HIV Screen 4th Generation wRfx NON REACTIVE NON REACTIVE  Wet prep, genital     Status: Abnormal   Collection Time: 12/07/19 11:58 AM   Specimen: Vaginal  Result Value Ref Range   Yeast Wet Prep HPF POC  NONE SEEN NONE SEEN   Trich, Wet Prep NONE SEEN NONE SEEN   Clue Cells Wet Prep HPF POC NONE SEEN NONE SEEN   WBC, Wet Prep HPF POC FEW (A) NONE SEEN   Sperm NONE SEEN     IMAGING US OB LESS THAN 14 WEEKS WITH OB TRANSVAGINAL  Result Date: 12/07/2019 CLINICAL DATA:  Vaginal bleeding in first trimester of pregnancy. EXAM: OBSTETRIC <14 WK [place] AND TRANSVAGINAL OB US TECHNIQUE: Both transabdominal and transvaginal ultrasound examinations were performed for complete evaluation of the gestation as well as the maternal uterus, adnexal regions, and pelvic cul-de-sac. Transvaginal technique was performed to assess early pregnancy. COMPARISON:  None. FINDINGS: Intrauterine gestational sac: Single but irregular in appearance. Yolk sac:  Visualized. Embryo:  Visualized. Cardiac Activity: Not Visualized. CRL:  19.4 mm   8 w   3 d                  [place] EDC: Jean 24, 2021 Subchorionic hemorrhage:  Small subchronic hemorrhage is noted. Maternal uterus/adnexae: Probable corpus luteum cyst seen in right ovary. Left ovary is unremarkable. Trace free fluid is noted which most likely is physiologic. No definite IUD is noted. IMPRESSION: Findings meet definitive criteria for failed pregnancy. This follows SRU consensus guidelines: Diagnostic Criteria for Nonviable Pregnancy Early in the First Trimester. [facility] [phone]. Electronically Signed   By: Lupita Raider M.D.   On: 12/07/2019 11:10    MAU COURSE CBC, Quant, ABO/Rh, ultrasound, wet prep and GC/chlamydia culture, UA  MDM Pain and bleeding in early pregnancy with missed AB measuring 8 weeks. Hemodynamically stable. Discussed options for management of incomplete AB including  expectant management, Cytotec or D&C. Prefers Cytotec at this time. Verbalizes understanding that intervention may become necessary if SAB is not completed w/ Cytotec or if heavy bleeding or infection occur. Cytotec given in MAU.  ASSESSMENT 1. Missed abortion   2. Vaginal bleeding in pregnancy, first trimester   3. Abdominal pain during pregnancy in first trimester   4. Miscarriage     PLAN Discharge home in stable condition. Bleeding  precautions Support given.  Declined Chaplain.  F/U 1-2 weeks [facility] Allergies as of 12/07/2019      Reactions   Drug Ingredient [iohexol] Hives   Pt developed hived after injection omni omni 350 09/12/19      Medication List    STOP taking these medications   azithromycin 250 MG tablet Commonly known as: ZITHROMAX     TAKE these medications   ibuprofen 600 MG tablet Commonly known as: ADVIL Take 1 tablet (600 mg total) by mouth every 6 (six) hours as needed for moderate pain or cramping.   misoprostol 200 MCG tablet Commonly known as: CYTOTEC Place 4 tablets (800 mcg total) vaginally once for 1 dose. If you have not passed pregnancy tissue by tomorrow afternoon (12/08/19)        Katrinka Blazing, [place], [place] 12/07/2019  10:31 PM  4

## 2019-12-07 NOTE — MAU Note (Signed)
While doing orthostatics per Sharen Counter CNM- pt able to sit on bedside but within 1-2 minutes of standing, pt got dizzy and was unable to get back in bed, 2 nurses assisted her in the sitting position by her bed, then 3 nurses assisted her up, back in to bed. Notified [employer].  Cool cloth to forehead. Pt reports she feels better. Instructed not to get up, call for assist.  Voiced understanding. Small amt of vaginal bleeding noted. Will continue to monitor blood pressure.

## 2019-12-08 DIAGNOSIS — Z3A13 13 weeks gestation of pregnancy: Secondary | ICD-10-CM | POA: Diagnosis not present

## 2019-12-08 DIAGNOSIS — O021 Missed abortion: Secondary | ICD-10-CM | POA: Diagnosis not present

## 2019-12-08 DIAGNOSIS — O09522 Supervision of elderly multigravida, second trimester: Secondary | ICD-10-CM | POA: Diagnosis not present

## 2019-12-08 DIAGNOSIS — O039 Complete or unspecified spontaneous abortion without complication: Secondary | ICD-10-CM

## 2019-12-08 LAB — URINALYSIS, ROUTINE W REFLEX MICROSCOPIC
Bilirubin Urine: NEGATIVE
Glucose, UA: NEGATIVE mg/dL
Hgb urine dipstick: NEGATIVE
Ketones, ur: NEGATIVE mg/dL
Leukocytes,Ua: NEGATIVE
Nitrite: NEGATIVE
Protein, ur: NEGATIVE mg/dL
Specific Gravity, Urine: 1.01 (ref 1.005–1.030)
pH: 6 (ref 5.0–8.0)

## 2019-12-08 LAB — COMPREHENSIVE METABOLIC PANEL
ALT: 17 U/L (ref 0–44)
AST: 15 U/L (ref 15–41)
Albumin: 2.5 g/dL — ABNORMAL LOW (ref 3.5–5.0)
Alkaline Phosphatase: 49 U/L (ref 38–126)
Anion gap: 8 (ref 5–15)
BUN: 10 mg/dL (ref 6–20)
CO2: 22 mmol/L (ref 22–32)
Calcium: 8 mg/dL — ABNORMAL LOW (ref 8.9–10.3)
Chloride: 108 mmol/L (ref 98–111)
Creatinine, Ser: 0.63 mg/dL (ref 0.44–1.00)
GFR calc Af Amer: >60 mL/min (ref >60)
GFR calc non Af Amer: >60 mL/min (ref >60)
Glucose, Bld: 137 mg/dL — ABNORMAL HIGH (ref 70–99)
Potassium: 4.1 mmol/L (ref 3.5–5.1)
Sodium: 138 mmol/L (ref 135–145)
Total Bilirubin: 0.6 mg/dL (ref 0.3–1.2)
Total Protein: 4.9 g/dL — ABNORMAL LOW (ref 6.5–8.1)

## 2019-12-08 LAB — CBC WITH DIFFERENTIAL/PLATELET
Abs Immature Granulocytes: 0.06 10*3/uL (ref 0.00–0.07)
Basophils Absolute: 0 10*3/uL (ref 0.0–0.1)
Basophils Relative: 0 %
Eosinophils Absolute: 0 10*3/uL (ref 0.0–0.5)
Eosinophils Relative: 0 %
HCT: 25.5 % — ABNORMAL LOW (ref 36.0–46.0)
Hemoglobin: 8.3 g/dL — ABNORMAL LOW (ref 12.0–15.0)
Immature Granulocytes: 1 %
Lymphocytes Relative: 13 %
Lymphs Abs: 1.6 10*3/uL (ref 0.7–4.0)
MCH: 28.5 pg (ref 26.0–34.0)
MCHC: 32.5 g/dL (ref 30.0–36.0)
MCV: 87.6 fL (ref 80.0–100.0)
Monocytes Absolute: 0.3 10*3/uL (ref 0.1–1.0)
Monocytes Relative: 2 %
Neutro Abs: 10.3 10*3/uL — ABNORMAL HIGH (ref 1.7–7.7)
Neutrophils Relative %: 84 %
Platelets: 183 10*3/uL (ref 150–400)
RBC: 2.91 MIL/uL — ABNORMAL LOW (ref 3.87–5.11)
RDW: 14.8 % (ref 11.5–15.5)
WBC: 12.3 10*3/uL — ABNORMAL HIGH (ref 4.0–10.5)
nRBC: 0 % (ref 0.0–0.2)

## 2019-12-08 LAB — PREPARE RBC (CROSSMATCH)

## 2019-12-08 LAB — GC/CHLAMYDIA PROBE AMP (~~LOC~~) NOT AT ARMC
Chlamydia: NEGATIVE
Comment: NEGATIVE
Comment: NORMAL
Neisseria Gonorrhea: NEGATIVE

## 2019-12-08 LAB — CULTURE, OB URINE
Culture: NO GROWTH
Special Requests: NORMAL

## 2019-12-08 LAB — ABO/RH: ABO/RH(D): O POS

## 2019-12-08 MED ORDER — SODIUM CHLORIDE 0.9% IV SOLUTION: Freq: Once | INTRAVENOUS | Status: AC

## 2019-12-08 MED ORDER — LACTATED RINGERS IV BOLUS
1000.0000 mL | Freq: Once | INTRAVENOUS | Status: AC
  Administered 2019-12-08: 1000 mL via INTRAVENOUS

## 2019-12-08 NOTE — Discharge Instructions (Signed)
Aborto espontneo Miscarriage El aborto espontneo es la prdida de un beb que no ha nacido (feto) antes de la semana20 del embarazo. La mayor parte de los abortos espontneos ocurre durante los primeros 3meses de embarazo. A veces, un aborto ocurre antes de que la mujer sepa que est embarazada. El aborto espontneo puede ser una experiencia que afecte emocionalmente a la persona. Si ha sufrido un aborto espontneo, hable con su mdico y hgale las preguntas que tenga sobre el aborto espontneo, el proceso de duelo y los planes futuros de embarazo. Cules son las causas? Entre las causas de un aborto espontneo se incluyen las siguientes:  Problemas genticos o cromosmicos del feto. Estos problemas impiden que el beb se desarrolle con normalidad. En general, son el resultado de errores fortuitos que ocurren en la etapa temprana del desarrollo y que no se transmiten de padres a hijos (no se heredan).  Infeccin en el cuello del tero.  Trastornos que afectan el equilibrio hormonal del organismo.  Problemas en el cuello del tero, como su adelgazamiento y apertura antes de que el embarazo llegue a trmino (insuficiencia del cuello de tero).  Problemas en el tero. Estos pueden incluir, entre otros, los siguientes: ? Forma anormal del tero. ? Fibromas en el tero. ? Anormalidades congnitas. Estos son problemas que ya estaban presentes en el nacimiento.  Ciertas enfermedades crnicas.  Fumar, beber alcohol o usar drogas.  Lesiones (traumatismos). En muchos de los casos, se desconoce la causa de los abortos espontneos. Cules son los signos o los sntomas? Los sntomas de esta afeccin incluyen los siguientes:  Sangrado o manchado vaginal, con o sin clicos o dolor.  Dolor o clicos en el abdomen o en la parte inferior de la espalda.  Eliminacin de lquido, tejidos o cogulos sanguneos por la vagina. Cmo se diagnostica? Esta afeccin se puede diagnosticar en funcin de  lo siguiente:  Examen fsico.  Ecografa.  Anlisis de sangre.  Anlisis de orina. Cmo se trata? En algunos casos, el tratamiento de un aborto espontneo no es necesario si se eliminan de forma natural todos los tejidos que se encontraban en el tero. Si fuera necesario realizar un tratamiento por esta afeccin, este puede incluir lo siguiente:  Dilatacin y curetaje (D&C). Mediante este procedimiento, se expande el cuello del tero y se raspan las paredes (endometrio). Esto se realiza solamente si queda tejido del feto o la placenta dentro del cuerpo (aborto espontneo incompleto).  Medicamentos, por ejemplo: ? Antibiticos para tratar una infeccin. ? Medicamentos para ayudar al cuerpo a eliminar los restos de tejido. ? Medicamentos para reducir (contraer) el tamao del tero. Estos medicamentos se pueden administrar si tiene un sangrado abundante. Si su factor sanguneo es Rhnegativo y el de su beb es Rhpositivo, usted necesitar una inyeccin del medicamento llamado inmunoglobulinaRhpara proteger a los bebs futuros de tener problemas con el factorsanguneoRh. Los trminos "Rhnegativo" y "Rhpositivo" hacen referencia a la presencia o no en la sangre de una protena especfica que se encuentra en la superficie de los glbulos rojos (factor Rh). Siga estas indicaciones en su casa: Medicamentos   Tome los medicamentos de venta libre y los recetados solamente como se lo haya indicado el mdico.  Si le recetaron antibiticos, tmelos como se lo haya indicado el mdico. No deje de tomar los antibiticos aunque comience a sentirse mejor.  No tome antiinflamatorios no esteroideos (AINE), tales como aspirina e ibuprofeno, a menos que se lo indique el mdico. Estos medicamentos pueden provocarle sangrado. Actividad  Haga   reposo segn lo indicado. Pregntele al mdico qu actividades son seguras para usted.  Pdale a alguien que la ayude con las responsabilidades familiares y del  hogar durante este tiempo. Instrucciones generales  Lleve un registro de la cantidad y la saturacin de las toallas higinicas que utiliza cada da. Anote esta informacin.  Anote la cantidad de tejido o cogulos sanguneos que expulsa por la vagina. Guarde las cantidades grandes de tejidos para que el mdico los examine.  No use tampones, no se haga duchas vaginales ni tenga relaciones sexuales hasta que el mdico la autorice.  Para que usted y su pareja puedan sobrellevar el proceso del duelo, hable con su mdico o busque apoyo psicolgico.  Cuando est lista, visite a su mdico para hablar sobre los pasos importantes que deber seguir en relacin con su salud. Tambin hable sobre las medidas que deber tomar para tener un embarazo saludable en el futuro.  Concurra a todas las visitas de seguimiento como se lo haya indicado el mdico. Esto es importante. Dnde encontrar ms informacin  Colegio Estadounidense de Obstetras y Gineclogos (American College of Obstetricians and Gynecologists): www.acog.org  Departamento de Salud y Servicios Humanos de los Estados Unidos, Oficina de Salud de la Mujer (U.S. Department of Health and Human Services, Office on Women's Health): www.womenshealth.gov Comunquese con un mdico si:  Tiene fiebre o siente escalofros.  Tiene una secrecin vaginal con mal olor.  El sangrado aumenta en vez de disminuir. Solicite ayuda de inmediato si:  Siente calambres intensos o dolor en la espalda o en el abdomen.  Elimina cogulos de sangre o tejido por la vagina del tamao de una nuez o ms grandes.  Necesita ms de una toalla higinica de tamao regular por hora.  Se siente mareada o dbil.  Se desmaya.  Siente una tristeza que la invade o piensa en lastimarse. Resumen  La mayor parte de los abortos espontneos ocurre durante los primeros 3meses de embarazo. En algunos casos, el aborto espontneo ocurre antes de que la mujer sepa que est  embarazada.  Siga las indicaciones del mdico para el cuidado en el hogar. Concurra a todas las visitas de control.  Para que usted y su pareja puedan sobrellevar el proceso del duelo, hable con su mdico o busque apoyo psicolgico. Esta informacin no tiene como fin reemplazar el consejo del mdico. Asegrese de hacerle al mdico cualquier pregunta que tenga. Document Revised: 06/15/2017 Document Reviewed: 06/15/2017 Elsevier Patient Education  2020 Elsevier Inc.  

## 2019-12-08 NOTE — MAU Note (Signed)
Using video interpreter pt voiced consent to receive blood and had no questions. Consent signed. Pt states has never received blood before. Interpreter #[id number] Marylene Land

## 2019-12-09 LAB — BPAM RBC
Blood Product Expiration Date: 202103222359
ISSUE DATE / TIME: 202103180405
Unit Type and Rh: 9500

## 2019-12-09 LAB — TYPE AND SCREEN
ABO/RH(D): O POS
Antibody Screen: NEGATIVE
Unit division: 0

## 2019-12-09 NOTE — Progress Notes (Signed)
See MAU note.    12/07/19 2137  What Happened  Was fall witnessed? Yes  Who witnessed fall?  [facility] Duffy Rhody RN, Holly Flippin RN)  Patients activity before fall  (Standing at bedside for ordered orthostatic blood pressures )  Point of contact buttocks;other (comment) (Pt assisted to sitting position on floor- no injury. )  Was patient injured? No  Follow Up  MD notified  (yes, Sharen Counter CNM)  Time MD notified 2138  Family notified Yes - comment (Pt called her family member once she was in bed)  Time family notified  (unsure of time, she called her family member few times )  Additional tests Yes-comment (see orders per provider)  Progress note created (see row info)  (see MAU note)  Adult Fall Risk Assessment  Risk Factor Category (scoring not indicated) History of more than one fall within 6 months before admission (document High fall risk);Fall has occurred during this admission (document High fall risk);High fall risk per protocol (document High fall risk)  Age 39  Fall History: Fall within 6 months prior to admission 5  Elimination; Bowel and/or Urine Incontinence 0  Elimination; Bowel and/or Urine Urgency/Frequency 0  Medications: includes PCA/Opiates, Anti-convulsants, Anti-hypertensives, Diuretics, Hypnotics, Laxatives, Sedatives, and Psychotropics 0  Patient Care Equipment 0  Mobility-Assistance 2  Mobility-Gait 2  Mobility-Sensory Deficit 0  Altered awareness of immediate physical environment 0  Impulsiveness 0  Lack of understanding of one's physical/cognitive limitations 0  Total Score 9  Patient Fall Risk Level High fall risk  Adult Fall Risk Interventions  Required Bundle Interventions *See Row Information* High fall risk - low, moderate, and high requirements implemented  Additional Interventions Room near nurses station;Safety Sitter/Safety Rounder;Use of appropriate toileting equipment (bedpan, BSC, etc.);Other (Comment) (Pt teaching regarding call  bell,not to get up without assist)

## 2019-12-29 ENCOUNTER — Other Ambulatory Visit: Payer: Self-pay

## 2019-12-29 ENCOUNTER — Ambulatory Visit (INDEPENDENT_AMBULATORY_CARE_PROVIDER_SITE_OTHER): Payer: Self-pay | Admitting: Medical

## 2019-12-29 ENCOUNTER — Encounter: Payer: Self-pay | Admitting: Medical

## 2019-12-29 VITALS — BP 114/75 | HR 76 | Ht 62.0 in | Wt 198.0 lb

## 2019-12-29 DIAGNOSIS — O039 Complete or unspecified spontaneous abortion without complication: Secondary | ICD-10-CM

## 2019-12-29 DIAGNOSIS — Z3009 Encounter for other general counseling and advice on contraception: Secondary | ICD-10-CM

## 2019-12-29 NOTE — Progress Notes (Signed)
     History:  Ms. Jean Dixon is a 39 y.o. [phone] who presents to clinic today for follow-up after SAB. The patient was diagnosed with failed first trimester pregnancy on 12/07/19. She was given Cytotec and had very heavy bleeding. She had to return to MAU for evaluation of heavy bleeding after a syncopal episode. Bleeding stabilized and she was discharged from MAU. She states only minimal bleeding after that episode and none today. She denies pain or fever. Quant hCG was 1452 on 12/07/19.  She does not desire another pregnancy soon and would like to consider OCPs.   The following portions of the patient's history were reviewed and updated as appropriate: allergies, current medications, family history, past medical history, social history, past surgical history and problem list.  Review of Systems:  Review of Systems  Constitutional: Negative for fever.  Gastrointestinal: Negative for abdominal pain.  Genitourinary:       Neg - vaginal bleeding, discharge      Objective:  Physical Exam BP 114/75   Pulse 76   Ht 5\' 2"  (1.575 m)   Wt 198 lb (89.8 kg)   LMP 09/06/2019 (Exact Date) Comment: negative beta HCG 09/12/19  Breastfeeding No   BMI 36.21 kg/m  Physical Exam  Nursing note and vitals reviewed. Constitutional: She is oriented to person, place, and time. She appears well-developed and well-nourished. No distress.  HENT:  Head: Normocephalic and atraumatic.  Cardiovascular: Normal rate.  Respiratory: Effort normal.  GI: Soft. She exhibits no distension.  Neurological: She is alert and oriented to person, place, and time.  Skin: Skin is warm and dry. No erythema.  Psychiatric: She has a normal mood and affect.    Labs and Imaging Results for orders placed or performed in visit on 12/29/19 (from the past 24 hour(s))  Beta hCG quant (ref lab)     Status: None   Collection Time: 12/29/19  5:35 PM  Result Value Ref Range   hCG Quant 2 mIU/mL   Narrative   Performed at:  [address] [place] [address] [place], [place]  [place] Lab Director: [id number] MD, Phone:  [phone]     Assessment & Plan:  1. SAB (spontaneous abortion) - Beta hCG quant (ref lab)  2. Unwanted fertility - When hCG resulted < 5, Rx for OCPs sent  - Discussed risk of OCPs with age > 58, patient is non-smoker and normotensive - norgestimate-ethinyl estradiol (ORTHO-CYCLEN) 0.25-35 MG-MCG tablet; Take 1 tablet by mouth daily.  Dispense: 1 Package; Refill: 11  Approximately 10 minutes of face-to-face time was spent with this patient   [birth year]1999 12/30/2019 8:36 AM

## 2019-12-30 ENCOUNTER — Telehealth: Payer: Self-pay

## 2019-12-30 ENCOUNTER — Encounter: Payer: Self-pay | Admitting: Medical

## 2019-12-30 LAB — BETA HCG QUANT (REF LAB): hCG Quant: 2 m[IU]/mL

## 2019-12-30 MED ORDER — NORGESTIMATE-ETH ESTRADIOL 0.25-35 MG-MCG PO TABS: 1.0000 | ORAL_TABLET | Freq: Every day | ORAL | 11 refills | Status: DC

## 2019-12-30 NOTE — Patient Instructions (Signed)
Aborto espontneo Miscarriage El aborto espontneo es la prdida de un beb que no ha nacido (feto) antes de la semana20 del embarazo. Siga estas indicaciones en su casa: Medicamentos   Tome los medicamentos de venta libre y los recetados solamente como se lo haya indicado el mdico.  Si le recetaron un antibitico, tmelo como se lo haya indicado el mdico. No deje de tomar los antibiticos aunque comience a sentirse mejor.  No tome antiinflamatorios no esteroideos (AINE), a menos que el mdico le diga que son seguros para usted. Estos incluyen aspirina e ibuprofeno. Estos medicamentos pueden provocarle sangrado. Actividad  Haga reposo segn lo indicado. Pregntele al mdico qu actividades son seguras para usted.  Pida ayuda para realizar las tareas de la casa durante este tiempo. Instrucciones generales  Anote cuntos apsitos usa por da y cun saturados estn.  Observe la cantidad de tejido o grumos de sangre (cogulos de sangre) que expulsa por la vagina. Guarde las cantidades grandes de tejido para llevrselas al mdico.  No use tampones, no se haga duchas vaginales ni tenga relaciones sexuales hasta que el mdico la autorice.  Para que usted y su pareja puedan sobrellevar el proceso de duelo, hable con su mdico o busque apoyo psicolgico.  Cuando est lista, acuda al mdico para hablar sobre los pasos que debe seguir para cuidar su salud. Adems, hable con su mdico sobre las medidas que debe adoptar para tener un embarazo saludable en el futuro.  Concurra a todas las visitas de seguimiento como se lo haya indicado el mdico. Esto es importante. Comunquese con un mdico si:  Tiene fiebre o siente escalofros.  Tiene una secrecin vaginal con mal olor.  Aumenta el sangrado. Solicite ayuda de inmediato si:  Tiene espasmos o dolor muy intensos en el abdomen o en la espalda.  Elimina grumos de sangre por la vagina, que tienen el tamao de una nuez o ms.  Elimina  tejido por la vagina, que tiene el tamao de una nuez o ms.  Empapa ms de un apsito de tamao normal por hora.  Se siente dbil o mareada.  Pierde el conocimiento (se desmaya).  Siente tristeza que no se va o piensa en lastimarse. Resumen  El aborto espontneo es la prdida de un beb que no ha nacido antes de la semana20 del embarazo.  Siga las indicaciones de su mdico para el cuidado en su hogar. Concurra a todas las visitas de control.  Para que usted y su pareja puedan sobrellevar el proceso de duelo, hable con su mdico o busque apoyo psicolgico. Esta informacin no tiene como fin reemplazar el consejo del mdico. Asegrese de hacerle al mdico cualquier pregunta que tenga. Document Revised: 06/15/2017 Document Reviewed: 06/15/2017 Elsevier Patient Education  2020 Elsevier Inc.  

## 2019-12-30 NOTE — Telephone Encounter (Signed)
-----   Message from Marny Lowenstein, PA-C sent at 12/30/2019  8:31 AM EDT ----- Patient does not have MyChart. Please inform her that hCG is back to normal for non-pregnant and that I have sent her Rx for OCPs to her pharmacy. She can start them this weekend.   Marny Lowenstein, PA-C 12/30/2019 8:30 AM

## 2019-12-30 NOTE — Telephone Encounter (Signed)
Jean Dixon interpreter called patient tro advise that her HCG levels are down and that we sent POP's to her pharmacy on file. Patient verbalized understanding and ended the call.

## 2020-07-17 IMAGING — CT CT CERVICAL SPINE W/O CM
3 of 4 series · 12 of 33 positions shown, 14 images · non-contrast
Comparison: None.

CLINICAL DATA: 39-year-old female with miscarriage and syncope.

EXAM:
CT HEAD WITHOUT CONTRAST
CT CERVICAL SPINE WITHOUT CONTRAST
TECHNIQUE: Multidetector CT imaging of the head and cervical spine was
performed following the standard protocol without intravenous
contrast. Multiplanar CT image reconstructions of the cervical spine
were also generated.

[Series 8: sag bone · sagittal · 0.20mm/px · 5 of 54 slices shown, 6 images]
[im 18/54  bone]
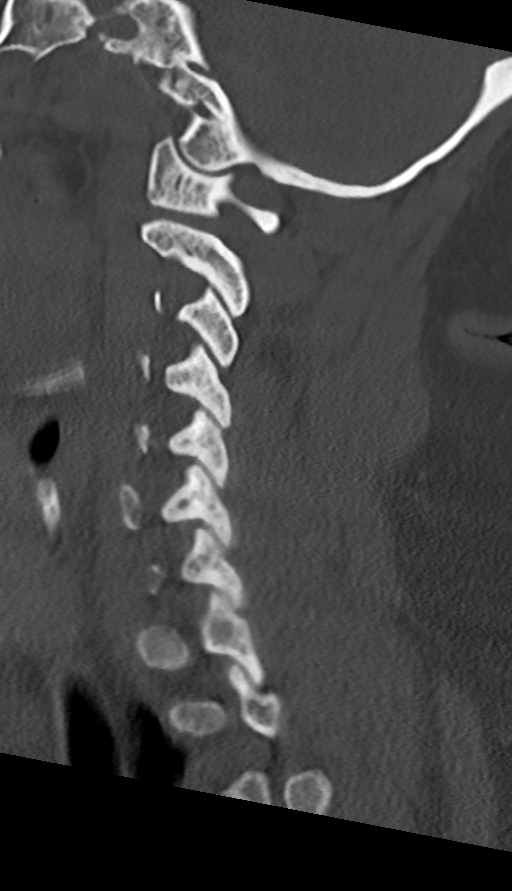
[im 23/54  bone]
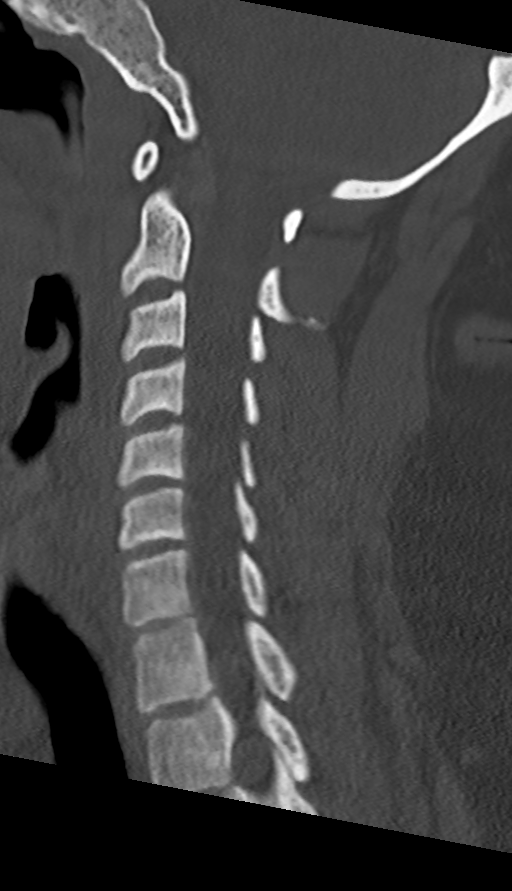
[im 27/54  soft-tissue]
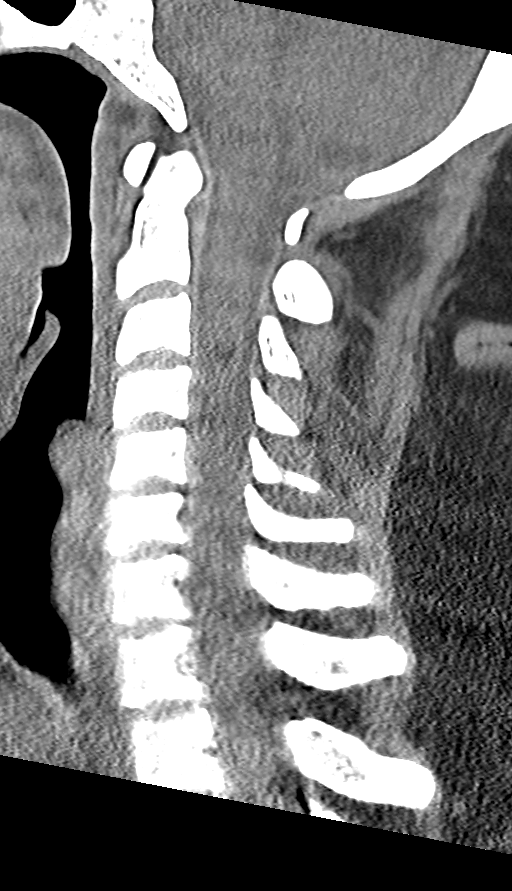
[im 27/54  bone]
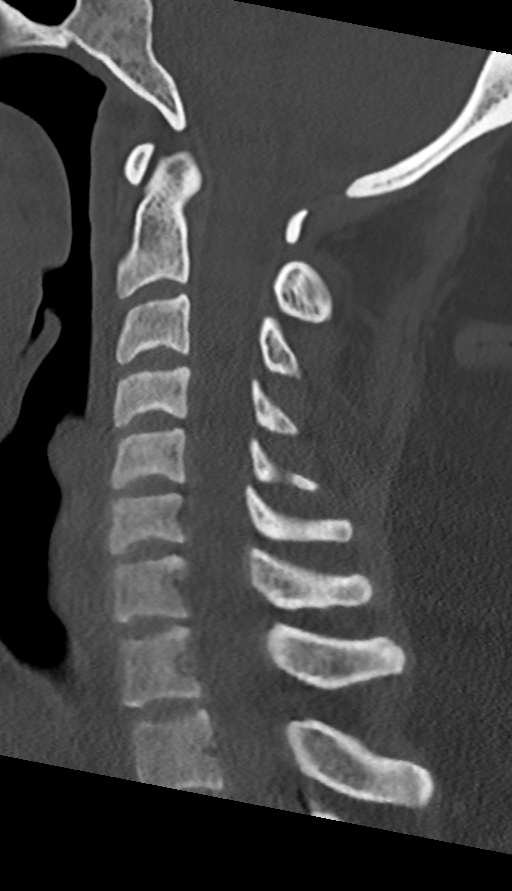
[im 31/54  bone]
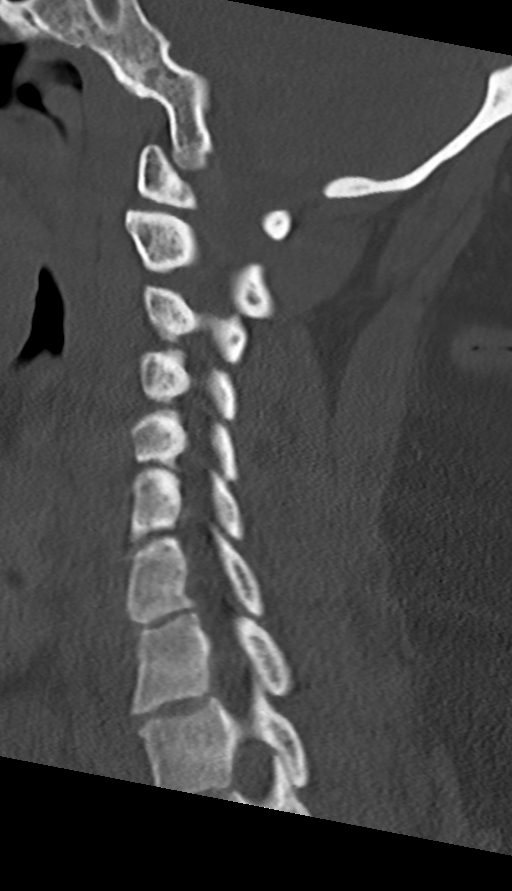
[im 36/54  bone]
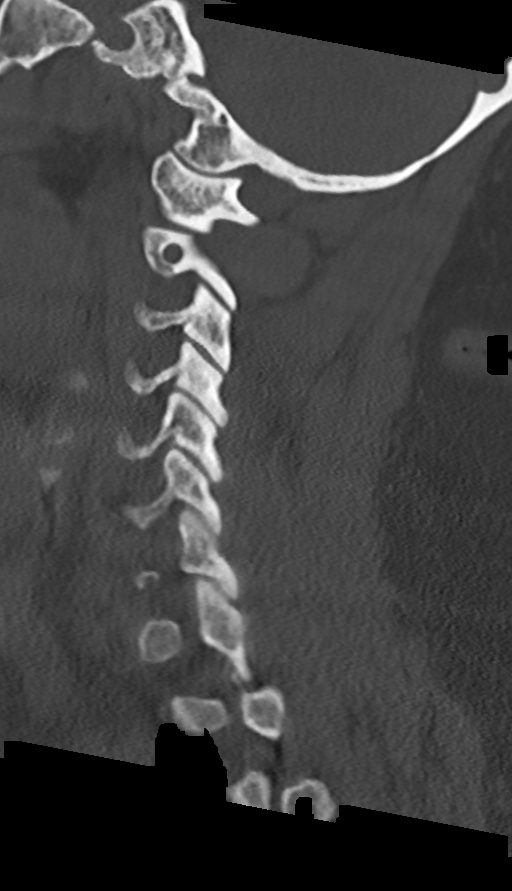

[Series 9: cor bone · coronal · 0.25mm/px · 3 of 47 slices shown]
[im 10/47  bone]
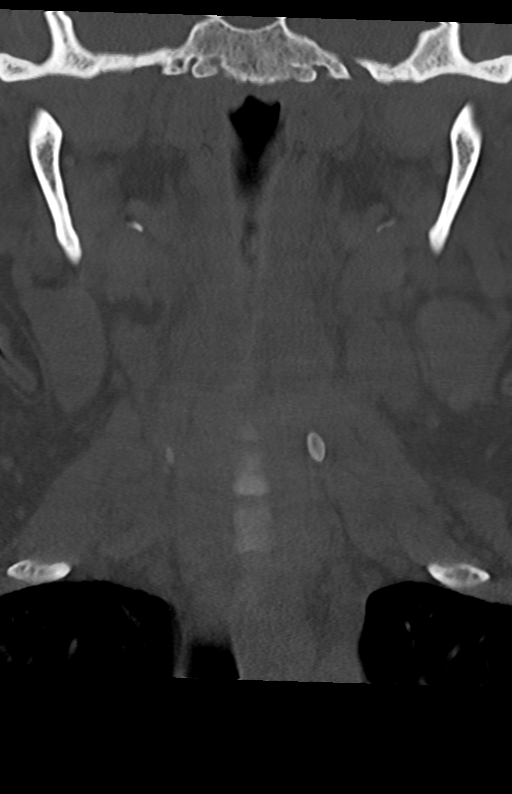
[im 19/47  bone]
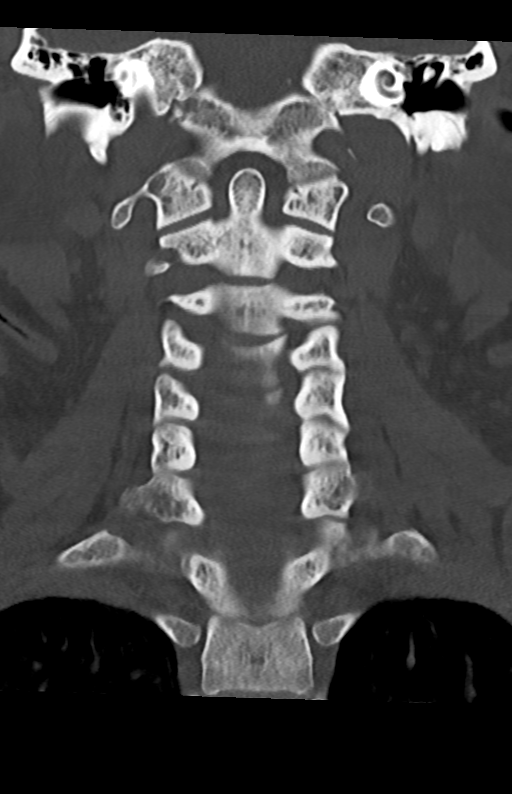
[im 28/47  bone]
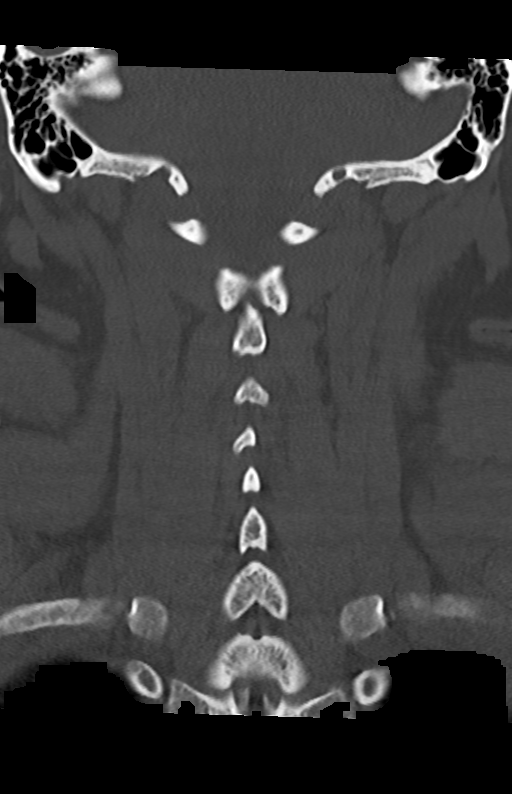

[Series 10: orthogonal axials · axial · 0.21mm/px · z∈[+1102,+1206]mm · 4 of 81 slices shown, 5 images]
[im 14/81  soft-tissue]
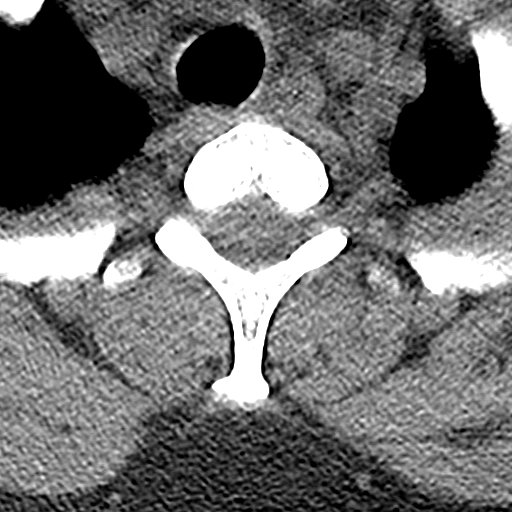
[im 14/81  bone]
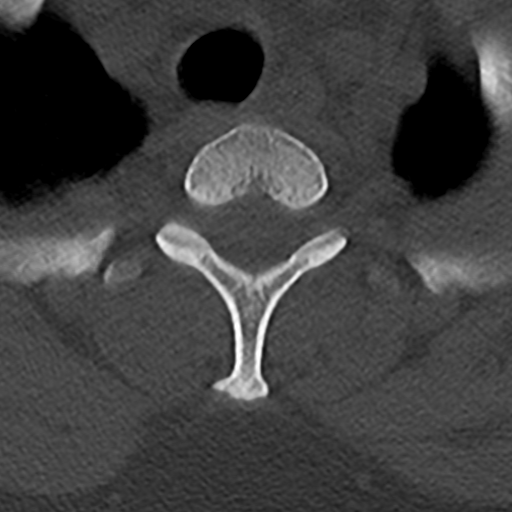
[im 27/81  bone]
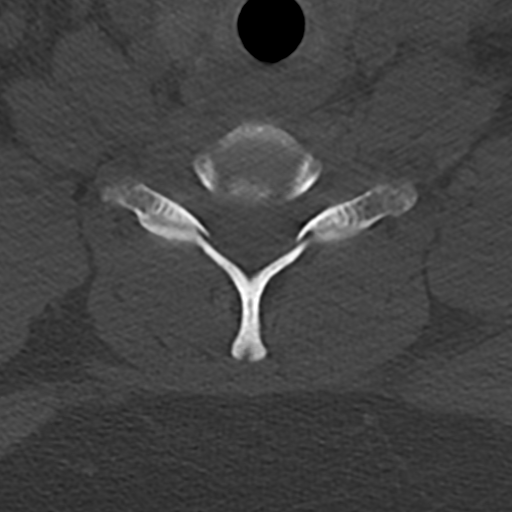
[im 54/81  bone]
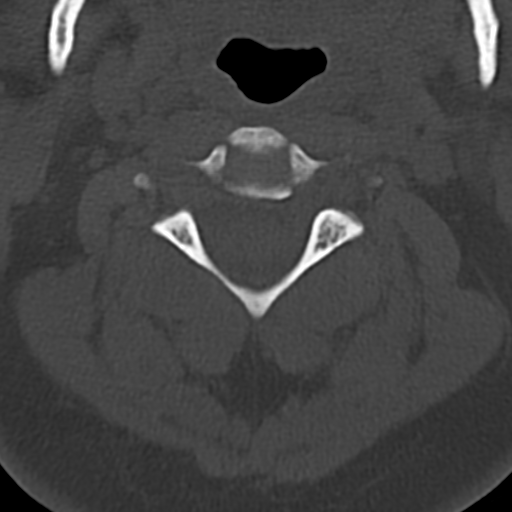
[im 67/81  bone]
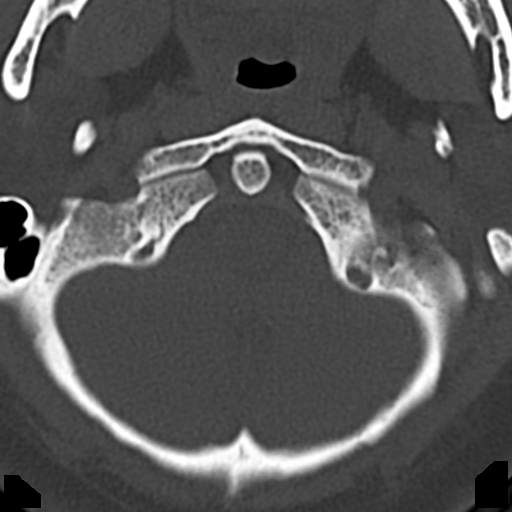

[12 of 33 positions shown; findings below may reference images not displayed]

FINDINGS: CT HEAD FINDINGS

Brain: No evidence of acute infarction, hemorrhage, hydrocephalus,
extra-axial collection or mass lesion/mass effect.

Vascular: No hyperdense vessel or unexpected calcification.

Skull: Normal. Negative for fracture or focal lesion.

Sinuses/Orbits: No acute finding.

Other: None

CT CERVICAL SPINE FINDINGS

Alignment: No acute subluxation

Skull base and vertebrae: No acute fracture. No primary bone lesion
or focal pathologic process.

Soft tissues and spinal canal: No prevertebral fluid or swelling. No
visible canal hematoma.

Disc levels: No acute findings. No significant degenerative changes.

Upper chest: Negative.

Other: None
IMPRESSION: 1. Normal unenhanced CT of the brain.
2. No acute/traumatic cervical spine pathology.

## 2020-07-17 IMAGING — CT CT HEAD W/O CM
4 series · 17 of 47 positions shown, 19 images · non-contrast
Comparison: None.

CLINICAL DATA: 39-year-old female with miscarriage and syncope.

EXAM:
CT HEAD WITHOUT CONTRAST
CT CERVICAL SPINE WITHOUT CONTRAST
TECHNIQUE: Multidetector CT imaging of the head and cervical spine was
performed following the standard protocol without intravenous
contrast. Multiplanar CT image reconstructions of the cervical spine
were also generated.

[Series 1: head wo · axial · 0.42mm/px · z∈[+1215,+1355]mm · 7 of 38 slices shown, 9 images]
[im 5/38  brain]
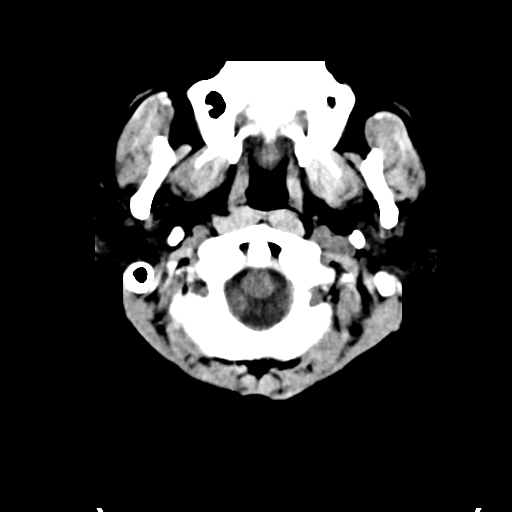
[im 5/38  bone]
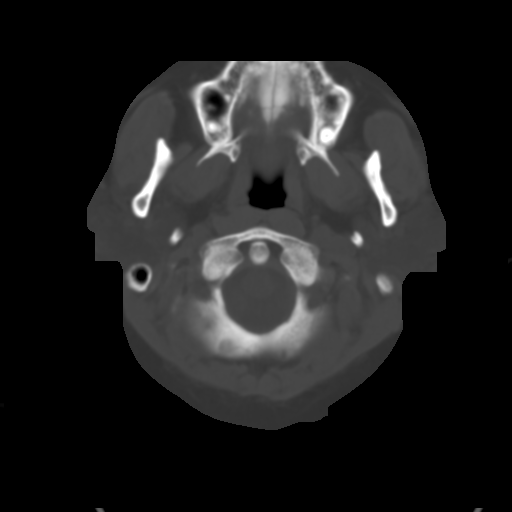
[im 10/38  brain]
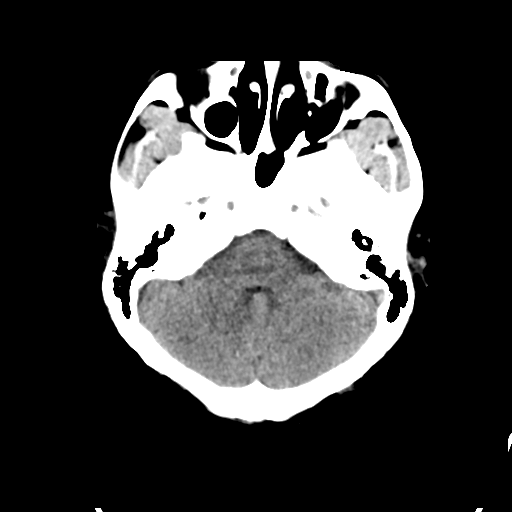
[im 14/38  brain]
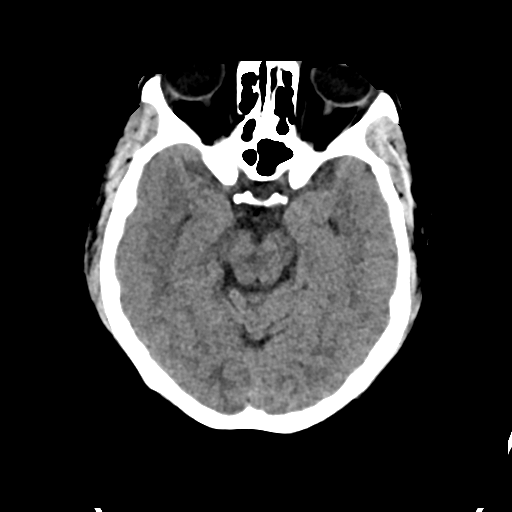
[im 19/38  brain]
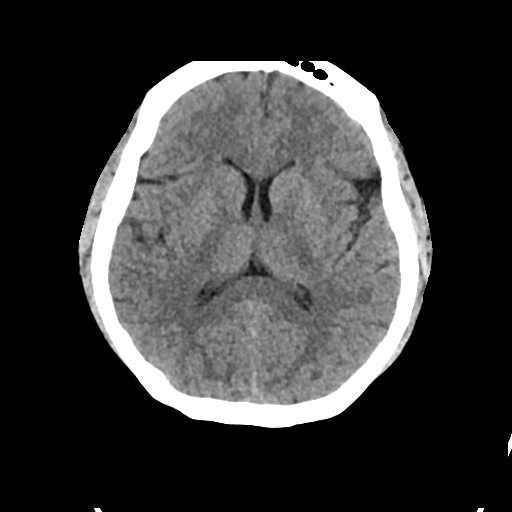
[im 24/38  brain]
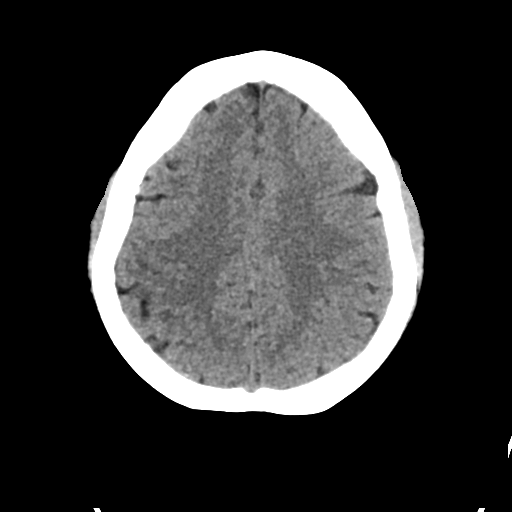
[im 24/38  bone]
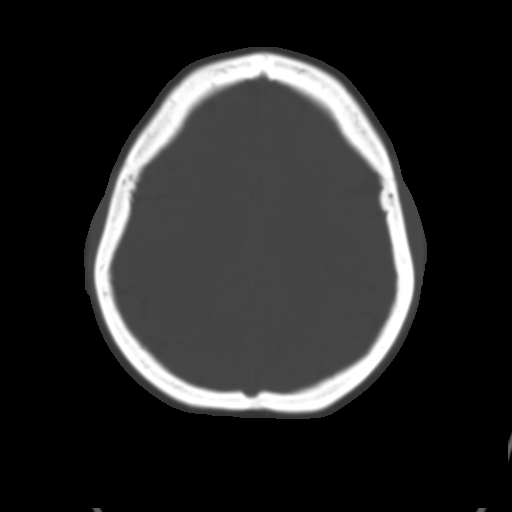
[im 28/38  brain]
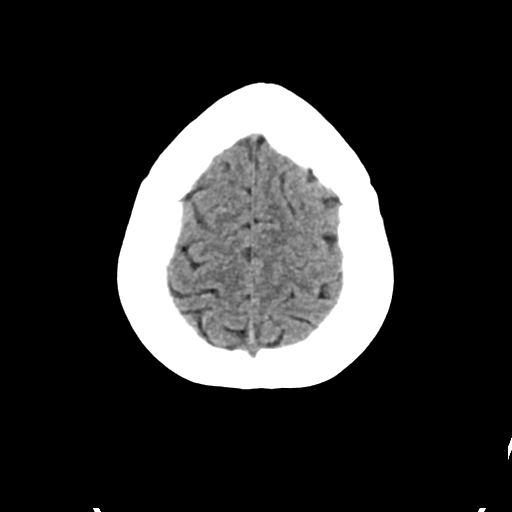
[im 33/38  brain]
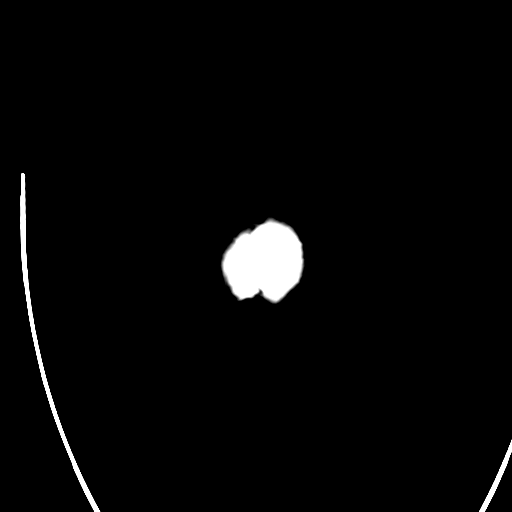

[Series 4: head bone · axial · 0.42mm/px · z∈[+1213,+1277]mm · 4 of 94 slices shown]
[im 10/94  bone]
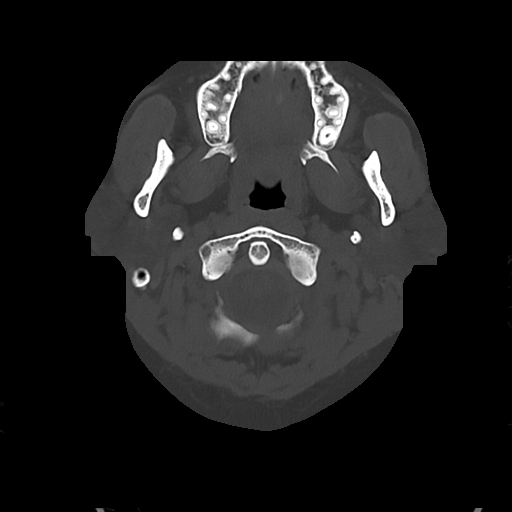
[im 19/94  bone]
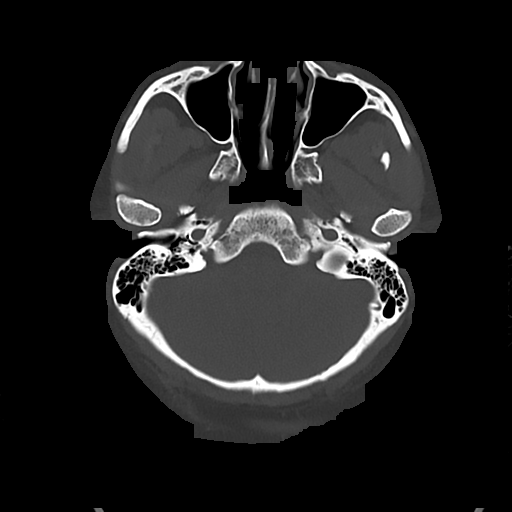
[im 28/94  bone]
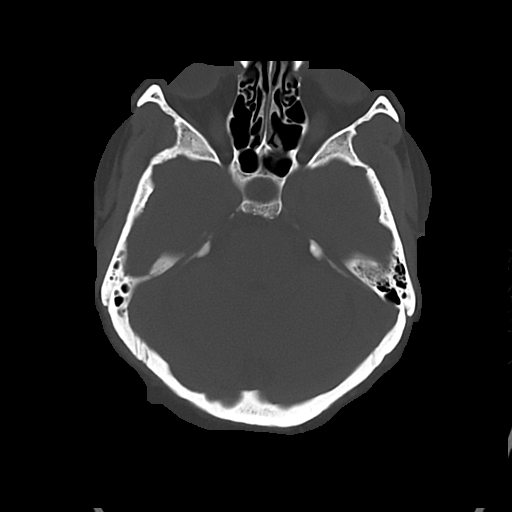
[im 42/94  bone]
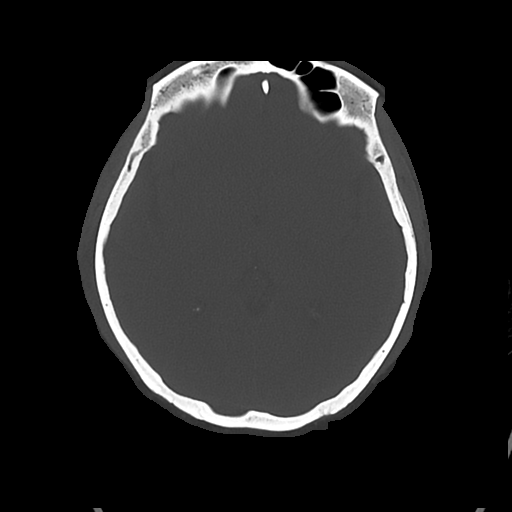

[Series 5: cor soft · coronal · 0.34mm/px · 3 of 66 slices shown]
[im 22/66  brain]
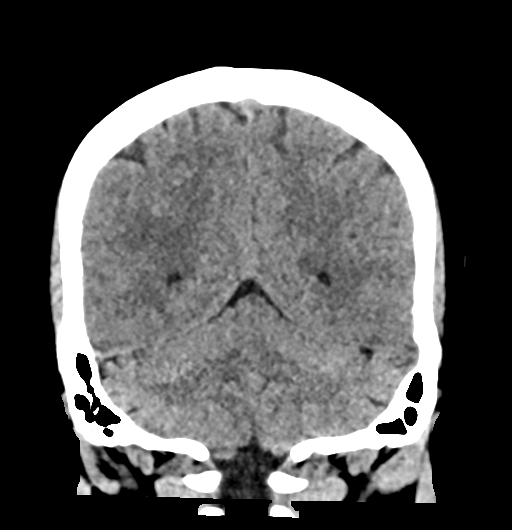
[im 29/66  brain]
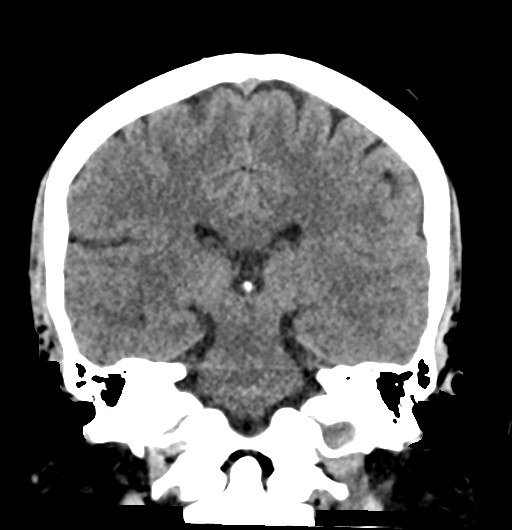
[im 37/66  brain]
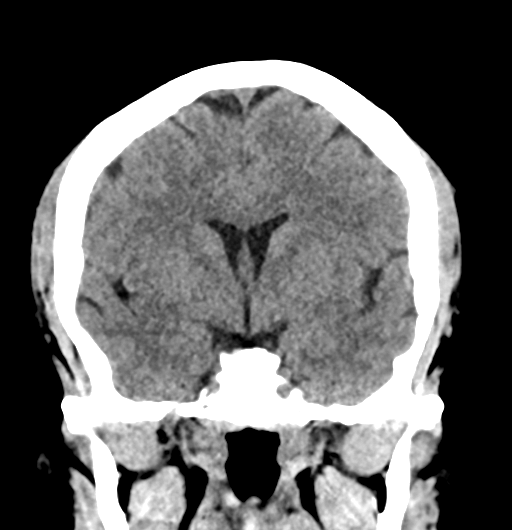

[Series 6: sag soft · sagittal · 0.36mm/px · 3 of 57 slices shown]
[im 19/57  brain]
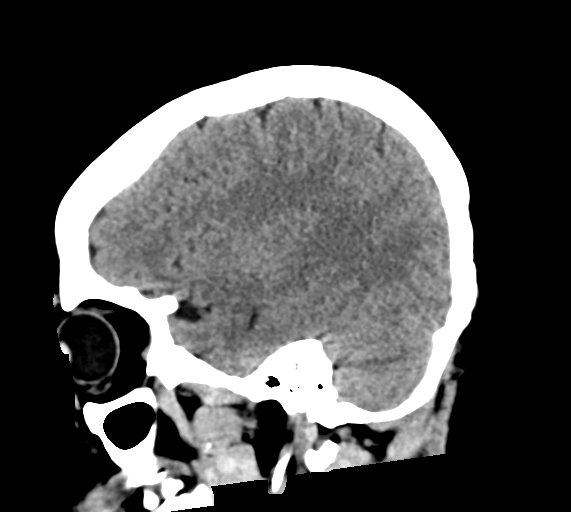
[im 29/57  brain]
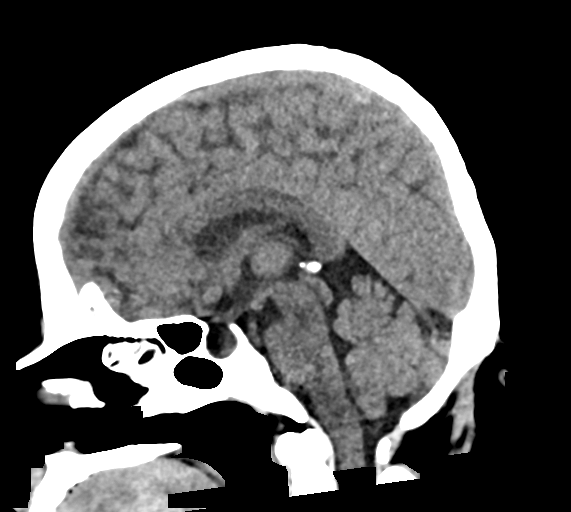
[im 38/57  brain]
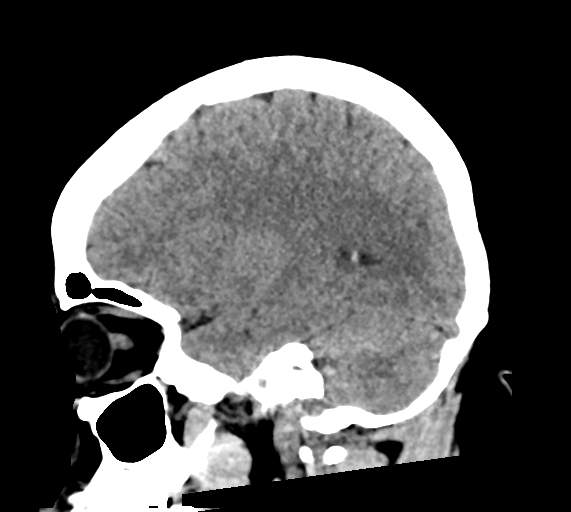

[17 of 47 positions shown; findings below may reference images not displayed]

FINDINGS: CT HEAD FINDINGS

Brain: No evidence of acute infarction, hemorrhage, hydrocephalus,
extra-axial collection or mass lesion/mass effect.

Vascular: No hyperdense vessel or unexpected calcification.

Skull: Normal. Negative for fracture or focal lesion.

Sinuses/Orbits: No acute finding.

Other: None

CT CERVICAL SPINE FINDINGS

Alignment: No acute subluxation

Skull base and vertebrae: No acute fracture. No primary bone lesion
or focal pathologic process.

Soft tissues and spinal canal: No prevertebral fluid or swelling. No
visible canal hematoma.

Disc levels: No acute findings. No significant degenerative changes.

Upper chest: Negative.

Other: None
IMPRESSION: 1. Normal unenhanced CT of the brain.
2. No acute/traumatic cervical spine pathology.

## 2022-11-10 ENCOUNTER — Ambulatory Visit (HOSPITAL_COMMUNITY)
Admission: EM | Admit: 2022-11-10 | Discharge: 2022-11-10 | Disposition: A | Discharge status: Home / Self Care | Payer: BC Managed Care – PPO | Attending: Physician Assistant | Admitting: Physician Assistant

## 2022-11-10 ENCOUNTER — Ambulatory Visit (INDEPENDENT_AMBULATORY_CARE_PROVIDER_SITE_OTHER): Payer: BC Managed Care – PPO

## 2022-11-10 ENCOUNTER — Encounter (HOSPITAL_COMMUNITY): Payer: Self-pay

## 2022-11-10 DIAGNOSIS — M25572 Pain in left ankle and joints of left foot: Secondary | ICD-10-CM | POA: Diagnosis not present

## 2022-11-10 DIAGNOSIS — S93402A Sprain of unspecified ligament of left ankle, initial encounter: Secondary | ICD-10-CM | POA: Diagnosis not present

## 2022-11-10 DIAGNOSIS — W11XXXA Fall on and from ladder, initial encounter: Secondary | ICD-10-CM

## 2022-11-10 MED ORDER — NAPROXEN 500 MG PO TABS: 500.0000 mg | ORAL_TABLET | Freq: Two times a day (BID) | ORAL | 0 refills | Status: AC

## 2022-11-10 NOTE — Discharge Instructions (Signed)
Your x-ray was normal with no evidence of a fracture/broken bone.  I suspect you have a sprain.  Keep your leg elevated and use the brace for comfort and support.  You can also use ice when you are resting.  Take Naprosyn for pain.  You should not take additional NSAIDs with this medication including aspirin, ibuprofen/Advil, naproxen/Aleve.  If your symptoms not improving quickly follow-up with sports medicine; call to schedule an appointment.  If anything worsens you should be seen immediately.

## 2022-11-10 NOTE — ED Triage Notes (Signed)
Called from [place]. Patient in the bathroom

## 2022-11-10 NOTE — ED Provider Notes (Signed)
[facility]    CSN: [id number] Arrival date & time: 11/10/22  1006      History   Chief Complaint Chief Complaint  Patient presents with   Ankle Pain    HPI Jean Dixon is a 42 y.o. female.   Patient presents today with pain and swelling of her lateral left ankle following injury.  Reports that she was cleaning something while standing on a ladder when she went to go down the ladder she took a misstep causing her to fall with the majority of her weight on her left ankle.  She has had ongoing pain since that time but has been able to bear weight.  Pain is rated 8 on a 0-10 pain scale, described as a throbbing, worse with attempted ambulation or manipulation, no alleviating factors identified.  Denies previous injury or surgery involving her ankle.  She is confident that she is not pregnant.  She has not tried any over-the-counter medication for symptom management.  Denies any numbness or paresthesias in her foot.    Past Medical History:  Diagnosis Date   Medical history non-contributory    No pertinent past medical history     There are no problems to display for this patient.   Past Surgical History:  Procedure Laterality Date   NO PAST SURGERIES      OB History     Gravida  4   Para  3   Term  3   Preterm      AB      Living  3      SAB      IAB      Ectopic      Multiple      Live Births  3            Home Medications    Prior to Admission medications   Medication Sig Start Date End Date Taking? Authorizing Provider  naproxen (NAPROSYN) 500 MG tablet Take 1 tablet (500 mg total) by mouth 2 (two) times daily with a meal. 11/10/22  Yes Vernessa Likes, Derry Skill, PA-C    Family History Family History  Problem Relation Age of Onset   Asthma Son    Healthy Mother    Healthy Father     Social History Social History   Tobacco Use   Smoking status: Never   Smokeless tobacco: Never  Vaping Use   Vaping Use: Never used   Substance Use Topics   Alcohol use: No   Drug use: No     Allergies   Drug ingredient [iohexol]   Review of Systems Review of Systems  Constitutional:  Positive for activity change. Negative for appetite change, fatigue and fever.  Musculoskeletal:  Positive for arthralgias, gait problem and joint swelling. Negative for myalgias.  Neurological:  Negative for weakness and numbness.     Physical Exam Triage Vital Signs ED Triage Vitals [11/10/22 1100]  Enc Vitals Group     BP 125/83     Pulse Rate 65     Resp 14     Temp 98.6 F (37 C)     Temp Source Oral     SpO2 98 %     Weight      Height      Head Circumference      Peak Flow      Pain Score 8     Pain Loc      Pain Edu?      Excl. in  GC?    No data found.  Updated Vital Signs BP 125/83 (BP Location: Right Arm)   Pulse 65   Temp 98.6 F (37 C) (Oral)   Resp 14   LMP 11/09/2022   SpO2 98%   Visual Acuity Right Eye Distance:   Left Eye Distance:   Bilateral Distance:    Right Eye Near:   Left Eye Near:    Bilateral Near:     Physical Exam Vitals reviewed.  Constitutional:      General: She is awake. She is not in acute distress.    Appearance: Normal appearance. She is well-developed. She is not ill-appearing.     Comments: Very pleasant female appears stated age in no acute distress sitting comfortably in exam room  HENT:     Head: Normocephalic and atraumatic.  Cardiovascular:     Rate and Rhythm: Normal rate and regular rhythm.     Pulses:          Posterior tibial pulses are 2+ on the left side.     Heart sounds: Normal heart sounds, S1 normal and S2 normal. No murmur heard.    Comments: Capillary refill within 2 seconds left toes Pulmonary:     Effort: Pulmonary effort is normal.     Breath sounds: Normal breath sounds. No wheezing, rhonchi or rales.     Comments: Clear to auscultation bilaterally Abdominal:     Palpations: Abdomen is soft.     Tenderness: There is no abdominal  tenderness.  Musculoskeletal:     Left ankle: Swelling present. No deformity. Tenderness present over the lateral malleolus. Decreased range of motion.     Left Achilles Tendon: No tenderness.     Comments: Left ankle: Tenderness palpation and swelling over lateral malleolus.  Normal active range of motion at ankle and toes.  Foot neurovascularly intact.  Psychiatric:        Behavior: Behavior is cooperative.      UC Treatments / Results  Labs (all labs ordered are listed, but only abnormal results are displayed) Labs Reviewed - No data to display  EKG   Radiology DG Ankle Complete Left  Result Date: 11/10/2022 CLINICAL DATA:  Fall off ladder and ankle.  Swelling and pain. EXAM: LEFT ANKLE COMPLETE - 3+ VIEW COMPARISON:  None Available. FINDINGS: There is no acute fracture or dislocation. Alignment is normal. The ankle mortise is intact. The joint spaces are preserved. There is no erosive change. There is minimal inferior calcaneal spurring. There is soft tissue swelling over the lateral malleolus. IMPRESSION: Soft tissue swelling over the lateral malleolus. No acute osseous abnormality. Electronically Signed   By: Valetta Mole M.D.   On: 11/10/2022 12:11    Procedures Procedures (including critical care time)  Medications Ordered in UC Medications - No data to display  Initial Impression / Assessment and Plan / UC Course  I have reviewed the triage vital signs and the nursing notes.  Pertinent labs & imaging results that were available during my care of the patient were reviewed by me and considered in my medical decision making (see chart for details).     Patient is well-appearing, afebrile, nontoxic, nontachycardic.  X-ray was obtained based on [place] ankle rules that showed no acute osseous abnormality.  Suspect sprain as etiology of symptoms.  Recommended RICE protocol.  She was placed in a brace for comfort and support.  Will start Naprosyn 500 mg for pain with instruction  not to take NSAIDs with this medication  due to risk of GI bleeding.  Can use acetaminophen/Tylenol for breakthrough pain.  Discussed that if her symptoms or not improving quickly she should follow-up with sports medicine was given contact information for local provider with instruction call to schedule an appointment.  Discussed that if she has any worsening or changing symptoms she needs to be seen immediately.  Strict return precautions given.  Work excuse note provided.  Final Clinical Impressions(s) / UC Diagnoses   Final diagnoses:  Sprain of left ankle, unspecified ligament, initial encounter  Acute left ankle pain     Discharge Instructions      Your x-ray was normal with no evidence of a fracture/broken bone.  I suspect you have a sprain.  Keep your leg elevated and use the brace for comfort and support.  You can also use ice when you are resting.  Take Naprosyn for pain.  You should not take additional NSAIDs with this medication including aspirin, ibuprofen/Advil, naproxen/Aleve.  If your symptoms not improving quickly follow-up with sports medicine; call to schedule an appointment.  If anything worsens you should be seen immediately.     ED Prescriptions     Medication Sig Dispense Auth. Provider   naproxen (NAPROSYN) 500 MG tablet Take 1 tablet (500 mg total) by mouth 2 (two) times daily with a meal. 20 tablet Seeley Southgate K, PA-C      PDMP not reviewed this encounter.   Terrilee Croak, PA-C 11/10/22 1227

## 2022-11-10 NOTE — ED Triage Notes (Signed)
Patient states she was on the second step of a ladder while cleaning and fell off of the ladder hurting her left ankle. Patient c/o swelling and pain.  Patient has not had any meds today.
